# Patient Record
Sex: Female | Born: 1995 | Race: Black or African American | Hispanic: No | Marital: Single | Smoking: Never smoker
Health system: Southern US, Community
[De-identification: ages and names within clinical notes are randomized; demographics above are authoritative.]

## PROBLEM LIST (undated history)

## (undated) ENCOUNTER — Inpatient Hospital Stay (HOSPITAL_COMMUNITY): Payer: Self-pay

## (undated) DIAGNOSIS — Z8619 Personal history of other infectious and parasitic diseases: Secondary | ICD-10-CM

## (undated) DIAGNOSIS — A749 Chlamydial infection, unspecified: Secondary | ICD-10-CM

## (undated) DIAGNOSIS — B999 Unspecified infectious disease: Secondary | ICD-10-CM

## (undated) DIAGNOSIS — A549 Gonococcal infection, unspecified: Secondary | ICD-10-CM

## (undated) HISTORY — DX: Personal history of other infectious and parasitic diseases: Z86.19

## (undated) HISTORY — PX: UMBILICAL HERNIA REPAIR: SHX196

---
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
## 2013-06-09 ENCOUNTER — Emergency Department (HOSPITAL_COMMUNITY)
Admission: EM | Admit: 2013-06-09 | Discharge: 2013-06-09 | Discharge status: Against Medical Advice | Payer: Managed Care, Other (non HMO) | Attending: Emergency Medicine | Admitting: Emergency Medicine

## 2013-06-09 ENCOUNTER — Encounter (HOSPITAL_COMMUNITY): Payer: Self-pay | Admitting: Adult Health

## 2013-06-09 DIAGNOSIS — N949 Unspecified condition associated with female genital organs and menstrual cycle: Secondary | ICD-10-CM | POA: Insufficient documentation

## 2013-06-09 DIAGNOSIS — R109 Unspecified abdominal pain: Secondary | ICD-10-CM | POA: Insufficient documentation

## 2013-06-09 DIAGNOSIS — N898 Other specified noninflammatory disorders of vagina: Secondary | ICD-10-CM | POA: Insufficient documentation

## 2013-06-09 DIAGNOSIS — Z3202 Encounter for pregnancy test, result negative: Secondary | ICD-10-CM | POA: Insufficient documentation

## 2013-06-09 DIAGNOSIS — A6000 Herpesviral infection of urogenital system, unspecified: Secondary | ICD-10-CM | POA: Insufficient documentation

## 2013-06-09 LAB — URINALYSIS, ROUTINE W REFLEX MICROSCOPIC
Glucose, UA: NEGATIVE mg/dL
Ketones, ur: NEGATIVE mg/dL
Specific Gravity, Urine: 1.034 — ABNORMAL HIGH (ref 1.005–1.030)
Urobilinogen, UA: 1 mg/dL (ref 0.0–1.0)
pH: 6 (ref 5.0–8.0)

## 2013-06-09 LAB — URINE MICROSCOPIC-ADD ON

## 2013-06-09 LAB — POCT PREGNANCY, URINE: Preg Test, Ur: NEGATIVE

## 2013-06-09 NOTE — ED Notes (Signed)
Presents with left lower abdominal pain, herpes outbreak with lesions on anus and vaginal bleeding. Pt reports she was taking medication to prevent outbreaks but finished it and now all the symptoms came back.

## 2013-06-10 LAB — URINE CULTURE

## 2013-06-11 NOTE — ED Notes (Signed)
Patient contact and informed to follow up with PCP per Recommendation of ID Pharmacist-Michelle Turner

## 2013-07-05 ENCOUNTER — Emergency Department (HOSPITAL_COMMUNITY)
Admission: EM | Admit: 2013-07-05 | Discharge: 2013-07-05 | Disposition: A | Discharge status: Home / Self Care | Payer: Managed Care, Other (non HMO) | Attending: Emergency Medicine | Admitting: Emergency Medicine

## 2013-07-05 ENCOUNTER — Encounter (HOSPITAL_COMMUNITY): Payer: Self-pay | Admitting: Emergency Medicine

## 2013-07-05 DIAGNOSIS — N938 Other specified abnormal uterine and vaginal bleeding: Secondary | ICD-10-CM | POA: Insufficient documentation

## 2013-07-05 DIAGNOSIS — N949 Unspecified condition associated with female genital organs and menstrual cycle: Secondary | ICD-10-CM | POA: Insufficient documentation

## 2013-07-05 DIAGNOSIS — Z79899 Other long term (current) drug therapy: Secondary | ICD-10-CM | POA: Insufficient documentation

## 2013-07-05 DIAGNOSIS — L299 Pruritus, unspecified: Secondary | ICD-10-CM | POA: Insufficient documentation

## 2013-07-05 DIAGNOSIS — Z8619 Personal history of other infectious and parasitic diseases: Secondary | ICD-10-CM | POA: Insufficient documentation

## 2013-07-05 DIAGNOSIS — T384X5A Adverse effect of oral contraceptives, initial encounter: Secondary | ICD-10-CM | POA: Insufficient documentation

## 2013-07-05 DIAGNOSIS — R110 Nausea: Secondary | ICD-10-CM | POA: Insufficient documentation

## 2013-07-05 LAB — CBC WITH DIFFERENTIAL/PLATELET
Basophils Relative: 0 % (ref 0–1)
Eosinophils Absolute: 0.2 10*3/uL (ref 0.0–0.7)
HCT: 38.6 % (ref 36.0–46.0)
Hemoglobin: 13.7 g/dL (ref 12.0–15.0)
Lymphs Abs: 3.6 10*3/uL (ref 0.7–4.0)
MCH: 28 pg (ref 26.0–34.0)
MCHC: 35.5 g/dL (ref 30.0–36.0)
MCV: 78.9 fL (ref 78.0–100.0)
Monocytes Absolute: 0.6 10*3/uL (ref 0.1–1.0)
Monocytes Relative: 7 % (ref 3–12)
Neutro Abs: 4.7 10*3/uL (ref 1.7–7.7)
Neutrophils Relative %: 52 % (ref 43–77)
RBC: 4.89 MIL/uL (ref 3.87–5.11)
WBC: 9.1 10*3/uL (ref 4.0–10.5)

## 2013-07-05 LAB — WET PREP, GENITAL
Clue Cells Wet Prep HPF POC: NONE SEEN
WBC, Wet Prep HPF POC: NONE SEEN

## 2013-07-05 MED ORDER — KETOROLAC TROMETHAMINE 60 MG/2ML IM SOLN
60.0000 mg | Freq: Once | INTRAMUSCULAR | Status: AC
  Administered 2013-07-05: 60 mg via INTRAMUSCULAR
  Filled 2013-07-05: qty 2

## 2013-07-05 NOTE — ED Notes (Signed)
Per pt sts woke up this am with vaginal pain, itching, pelvic pain. sts foul odor and burning.

## 2013-07-05 NOTE — ED Provider Notes (Signed)
CSN: [id number]     Arrival date & time 07/05/13  1647 History   First MD Initiated Contact with Patient 07/05/13 1818     Chief Complaint  Patient presents with  . Vaginitis   (Consider location/radiation/quality/duration/timing/severity/associated sxs/prior Treatment) HPI  PT is G0P0, states she had started on depoprovera in January and she has had vaginal bleeding daily since. She states it has been heavy since February. She states today she had lower abdominal pain and took ibuprofen. She had nausea and felt hot, no vomiting. She c/o burning and itching in her groin. She states in June she had "cuts" in her groin and was diagnosed with herpes, and took acyclovir, but later had a test that was negative for herpes. States she is sexually active. She states when she stays on the acyclovir she has less groin pain but she has run out of her acyclovir.  She thinks she may have "cuts" again.   PCP [facility]  Past Medical History  Diagnosis Date  . Herpes genitalis in women    Past Surgical History  Procedure Laterality Date  . Umbilical hernia repair     History reviewed. No pertinent family history. History  Substance Use Topics  . Smoking status: Never Smoker   . Smokeless tobacco: Not on file  . Alcohol Use: No   employed  OB History   Grav Para Term Preterm Abortions TAB SAB Ect Mult Living                 Review of Systems  All other systems reviewed and are negative.    Allergies  Review of patient's allergies indicates no known allergies.  Home Medications   Current Outpatient Rx  Name  Route  Sig  Dispense  Refill  . acyclovir (ZOVIRAX) 200 MG capsule   Oral   Take 200 mg by mouth daily as needed (outbreak).         Marland Kitchen ibuprofen (ADVIL,MOTRIN) 200 MG tablet   Oral   Take 800 mg by mouth every 8 (eight) hours as needed for pain.          . MedroxyPROGESTERone Acetate (DEPO-PROVERA IM)   Intramuscular   Inject  into the muscle every 30 (thirty) days.          BP 125/72  Pulse 81  Temp(Src) 98.8 F (37.1 C) (Oral)  Resp 18  Wt 155 lb 9.6 oz (70.58 kg)  BMI 27.57 kg/m2  SpO2 98%  Vital signs normal   Physical Exam  Nursing note and vitals reviewed. Constitutional: She is oriented to person, place, and time. She appears well-developed and well-nourished.  Non-toxic appearance. She does not appear ill. No distress.  HENT:  Head: Normocephalic and atraumatic.  Right Ear: External ear normal.  Left Ear: External ear normal.  Nose: Nose normal. No mucosal edema or rhinorrhea.  Mouth/Throat: Oropharynx is clear and moist and mucous membranes are normal. No dental abscesses or uvula swelling.  Eyes: Conjunctivae and EOM are normal. Pupils are equal, round, and reactive to light.  Neck: Normal range of motion and full passive range of motion without pain. Neck supple.  Cardiovascular: Normal rate, regular rhythm and normal heart sounds.  Exam reveals no gallop and no friction rub.   No murmur heard. Pulmonary/Chest: Effort normal and breath sounds normal. No respiratory distress. She has no wheezes. She has no rhonchi. She has no rales. She exhibits no tenderness and no crepitus.  Abdominal: Soft.  Normal appearance and bowel sounds are normal. She exhibits no distension. There is tenderness in the left lower quadrant. There is no rebound and no guarding.  Genitourinary:  Normal external genitalia. Her groin was inspected and no ulcers or unusual lesions were seen on her labia or in her labia or around her vaginal opening. She has a small amount of dark blood in her fall. Her uterus is tender to palpation and does not feel enlarged. Her adnexa are nontender and are nonpalpable.  Musculoskeletal: Normal range of motion. She exhibits no edema and no tenderness.  Moves all extremities well.   Neurological: She is alert and oriented to person, place, and time. She has normal strength. No cranial nerve  deficit.  Skin: Skin is warm, dry and intact. No rash noted. No erythema. No pallor.  Psychiatric: She has a normal mood and affect. Her speech is normal and behavior is normal. Her mood appears not anxious.    ED Course  Procedures (including critical care time)  Medications  ketorolac (TORADOL) injection 60 mg (60 mg Intramuscular Given 07/05/13 1928)    Labs Review Results for orders placed during the hospital encounter of 07/05/13  WET PREP, GENITAL      Result Value Range   Yeast Wet Prep HPF POC NONE SEEN  NONE SEEN   Trich, Wet Prep NONE SEEN  NONE SEEN   Clue Cells Wet Prep HPF POC NONE SEEN  NONE SEEN   WBC, Wet Prep HPF POC NONE SEEN  NONE SEEN  CBC WITH DIFFERENTIAL      Result Value Range   WBC 9.1  4.0 - 10.5 K/uL   RBC 4.89  3.87 - 5.11 MIL/uL   Hemoglobin 13.7  12.0 - 15.0 g/dL   HCT 41.6  60.6 - 30.1 %   MCV 78.9  78.0 - 100.0 fL   MCH 28.0  26.0 - 34.0 pg   MCHC 35.5  30.0 - 36.0 g/dL   RDW 60.1  09.3 - 23.5 %   Platelets 332  150 - 400 K/uL   Neutrophils Relative % 52  43 - 77 %   Neutro Abs 4.7  1.7 - 7.7 K/uL   Lymphocytes Relative 40  12 - 46 %   Lymphs Abs 3.6  0.7 - 4.0 K/uL   Monocytes Relative 7  3 - 12 %   Monocytes Absolute 0.6  0.1 - 1.0 K/uL   Eosinophils Relative 2  0 - 5 %   Eosinophils Absolute 0.2  0.0 - 0.7 K/uL   Basophils Relative 0  0 - 1 %   Basophils Absolute 0.0  0.0 - 0.1 K/uL   Laboratory interpretation all normal      MDM   1. DUB (dysfunctional uterine bleeding)       Plan discharge   Devoria Albe, MD, Armando Gang    Ward Givens, MD 07/05/13 2025

## 2013-07-05 NOTE — Discharge Instructions (Signed)
You need to discuss your persistent vaginal bleeding since you started the depoprovera. Your initial swab for infection was negative. You will be called if your STD tests are positive.    Abnormal Uterine Bleeding Abnormal uterine bleeding can have many causes. Some cases are simply treated, while others are more serious. There are several kinds of bleeding that is considered abnormal, including:  Bleeding between periods.  Bleeding after sexual intercourse.  Spotting anytime in the menstrual cycle.  Bleeding heavier or more than normal.  Bleeding after menopause. CAUSES  There are many causes of abnormal uterine bleeding. It can be present in teenagers, pregnant women, women during their reproductive years, and women who have reached menopause. Your caregiver will look for the more common causes depending on your age, signs, symptoms and your particular circumstance. Most cases are not serious and can be treated. Even the more serious causes, like cancer of the female organs, can be treated adequately if found in the early stages. That is why all types of bleeding should be evaluated and treated as soon as possible. DIAGNOSIS  Diagnosing the cause may take several kinds of tests. Your caregiver may:  Take a complete history of the type of bleeding.  Perform a complete physical exam and Pap smear.  Take an ultrasound on the abdomen showing a picture of the female organs and the pelvis.  Inject dye into the uterus and Fallopian tubes and X-ray them (hysterosalpingogram).  Place fluid in the uterus and do an ultrasound (sonohysterogrqphy).  Take a CT scan to examine the female organs and pelvis.  Take an MRI to examine the female organs and pelvis. There is no X-ray involved with this procedure.  Look inside the uterus with a telescope that has a light at the end (hysteroscopy).  Scrap the inside of the uterus to get tissue to examine (Dilatation and Curettage, D&C).  Look into  the pelvis with a telescope that has a light at the end (laparoscopy). This is done through a very small cut (incision) in the abdomen. TREATMENT  Treatment will depend on the cause of the abnormal bleeding. It can include:  Doing nothing to allow the problem to take care of itself over time.  Hormone treatment.  Birth control pills.  Treating the medical condition causing the problem.  Laparoscopy.  Major or minor surgery  Destroying the lining of the uterus with electrical currant, laser, freezing or heat (uterine ablation). HOME CARE INSTRUCTIONS   Follow your caregiver's recommendation on how to treat your problem.  See your caregiver if you missed a menstrual period and think you may be pregnant.  If you are bleeding heavily, count the number of pads/tampons you use and how often you have to change them. Tell this to your caregiver.  Avoid sexual intercourse until the problem is controlled. SEEK MEDICAL CARE IF:   You have any kind of abnormal bleeding mentioned above.  You feel dizzy at times.  You are 17 years old and have not had a menstrual period yet. SEEK IMMEDIATE MEDICAL CARE IF:   You pass out.  You are changing pads/tampons every 15 to 30 minutes.  You have belly (abdominal) pain.  You have a temperature of 100 F (37.8 C) or higher.  You become sweaty or weak.  You are passing large blood clots from the vagina.  You start to feel sick to your stomach (nauseous) and throw up (vomit). Document Released: 08/27/2005 Document Revised: 11/19/2011 Document Reviewed: 01/20/2009 [employer] Patient Information 2014  [employer] ° °

## 2013-07-06 LAB — HIV ANTIBODY (ROUTINE TESTING W REFLEX): HIV: NONREACTIVE

## 2013-07-06 LAB — GC/CHLAMYDIA PROBE AMP: GC Probe RNA: NEGATIVE

## 2013-07-06 LAB — RPR: RPR Ser Ql: NONREACTIVE

## 2013-09-05 ENCOUNTER — Encounter (HOSPITAL_COMMUNITY): Payer: Self-pay | Admitting: *Deleted

## 2013-09-05 ENCOUNTER — Inpatient Hospital Stay (HOSPITAL_COMMUNITY)
Admission: AD | Admit: 2013-09-05 | Discharge: 2013-09-05 | Disposition: A | Discharge status: Home / Self Care | Payer: Managed Care, Other (non HMO) | Source: Ambulatory Visit | Attending: Obstetrics and Gynecology | Admitting: Obstetrics and Gynecology

## 2013-09-05 ENCOUNTER — Inpatient Hospital Stay (HOSPITAL_COMMUNITY): Payer: Managed Care, Other (non HMO)

## 2013-09-05 DIAGNOSIS — G8929 Other chronic pain: Secondary | ICD-10-CM | POA: Insufficient documentation

## 2013-09-05 DIAGNOSIS — N920 Excessive and frequent menstruation with regular cycle: Secondary | ICD-10-CM | POA: Insufficient documentation

## 2013-09-05 DIAGNOSIS — N949 Unspecified condition associated with female genital organs and menstrual cycle: Secondary | ICD-10-CM | POA: Insufficient documentation

## 2013-09-05 DIAGNOSIS — A6000 Herpesviral infection of urogenital system, unspecified: Secondary | ICD-10-CM | POA: Insufficient documentation

## 2013-09-05 LAB — URINE MICROSCOPIC-ADD ON

## 2013-09-05 LAB — URINALYSIS, ROUTINE W REFLEX MICROSCOPIC
Bilirubin Urine: NEGATIVE
Glucose, UA: NEGATIVE mg/dL
Ketones, ur: NEGATIVE mg/dL
Nitrite: NEGATIVE
Protein, ur: NEGATIVE mg/dL
Specific Gravity, Urine: 1.025 (ref 1.005–1.030)

## 2013-09-05 LAB — POCT PREGNANCY, URINE: Preg Test, Ur: NEGATIVE

## 2013-09-05 MED ORDER — TRAMADOL HCL 50 MG PO TABS: 50.0000 mg | ORAL_TABLET | Freq: Four times a day (QID) | ORAL | Status: DC | PRN

## 2013-09-05 MED ORDER — KETOROLAC TROMETHAMINE 10 MG PO TABS: 10.0000 mg | ORAL_TABLET | Freq: Four times a day (QID) | ORAL | Status: DC | PRN

## 2013-09-05 MED ORDER — KETOROLAC TROMETHAMINE 60 MG/2ML IM SOLN
60.0000 mg | Freq: Once | INTRAMUSCULAR | Status: AC
  Administered 2013-09-05: 60 mg via INTRAMUSCULAR
  Filled 2013-09-05: qty 2

## 2013-09-05 NOTE — MAU Note (Signed)
Pt reports she has had increased vaginal bleeding and pelvic pain . Dx with herpes last year. Had an outbreak. Pt has been on Depo last January (get shot at [facility]) and has had continuous bleeding.  Blededing heavier the past 2 days with cramping. Called [facility]

## 2013-09-05 NOTE — MAU Provider Note (Signed)
History   17 yo G0 presented unannounced c/o 2 weeks of increased vaginal bleeding and pelvic pain.   On Depo x 1 year, with ongoing bleeding since initiation.  Started on OCPs due to menorrhagia with cycles--previous Seasonique user, but couldn't remember to take daily pill.    Started Depo in January, 2014, in an attempt to control bleeding.  Has had on-going issues with pelvic pain, with several ER visits for same over last few years.  Had recent HSV outbreak, took Acyclovir with benefit.   "Have had all kinds of trouble since I became sexually active".  Was told she had "something on her cervix" on her last exam.    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  Has taken Aleve around the clock last 24 hours with minimal benefit.  Last Depo October--due next one by Jan 1.  Receives her Depo at her primary MD office.    Seen at [facility] in September as new patient by Dr. Merlinda Frederick normal pelvic US.  Plan for repeat HSV titers in 6 months due to positive HSV IgM, but negative HSV 1 and 2 IgG titers.  Seen at primary [facility] practice in October, with negative GC/chlamydia then.  Chief Complaint  Patient presents with  . Vaginal Bleeding   HPI:  See above  OB History   Grav Para Term Preterm Abortions TAB SAB Ect Mult Living   0               Past Medical History  Diagnosis Date  . Herpes genitalis in women     Past Surgical History  Procedure Laterality Date  . Umbilical hernia repair      No family history on file.  History  Substance Use Topics  . Smoking status: Never Smoker   . Smokeless tobacco: Not on file  . Alcohol Use: No    Allergies: No Known Allergies  Prescriptions prior to admission  Medication Sig Dispense Refill  . acyclovir (ZOVIRAX) 200 MG capsule Take 200 mg by mouth daily as needed (outbreak).      Marland Kitchen ibuprofen (ADVIL,MOTRIN) 200 MG tablet Take 800 mg by mouth every 8 (eight) hours as needed for pain.       .  medroxyPROGESTERone (DEPO-PROVERA) 150 MG/ML injection Inject 150 mg into the muscle every 3 (three) months.        ROS:  Pelvic pain, vaginal bleeding Physical Exam   Blood pressure 142/90, pulse 96, temperature 98.8 F (37.1 C), temperature source Oral, resp. rate 18, height 5\' 3"  (1.6 m), weight 164 lb 6.4 oz (74.571 kg).  Physical Exam Chest clear Heart RRR without murmur Abd soft, NT, no rebound or guarding No CVAT Pelvic--moderate blood in vault, with small clots--minimal active bleeding at present.  Uterus small, moderately tender, no CMT, small nabothian cyst noted on cervix.  Left adnexa more tender than right, no masses or fullness noted. Ext WNL  ED Course  Pelvic pain Menorrhagia--on Depo Hx HSV  Plan: Toradol 60 mg IM now GC, chlamydia UPT CBC Pelvic US Reviewed issues of chronic pelvic pain and menorrhagia with patient, with discussion of focus of ER visit as emergent evaluation of health-threatening issues, with plan for office f/u for more long-term management of the issues.   Nigel Bridgeman CNM, [place] 09/05/2013 5:04 PM  Addendum: Returned from [place]. Pain improved with Toradol. Small amount bleeding.  IMPRESSION:  Mildly inhomogeneous endometrium without measurable focal  abnormality. This could be due to  timing with respect to the  patient's cycle, although polyp, hyperplasia/neoplasia, or  submucosal fibroid could appear similar. Consider reimaging during  the week following the patient's menses if possible in 4-6 weeks.  Results for orders placed during the hospital encounter of 09/05/13 (from the past 24 hour(s))  URINALYSIS, ROUTINE W REFLEX MICROSCOPIC     Status: Abnormal   Collection Time    09/05/13  3:07 PM      Result Value Range   Color, Urine YELLOW  YELLOW   APPearance HAZY (*) CLEAR   Specific Gravity, Urine 1.025  1.005 - 1.030   pH 8.0  5.0 - 8.0   Glucose, UA NEGATIVE  NEGATIVE mg/dL   Hgb urine dipstick TRACE (*) NEGATIVE    Bilirubin Urine NEGATIVE  NEGATIVE   Ketones, ur NEGATIVE  NEGATIVE mg/dL   Protein, ur NEGATIVE  NEGATIVE mg/dL   Urobilinogen, UA 0.2  0.0 - 1.0 mg/dL   Nitrite NEGATIVE  NEGATIVE   Leukocytes, UA NEGATIVE  NEGATIVE  URINE MICROSCOPIC-ADD ON     Status: Abnormal   Collection Time    09/05/13  3:07 PM      Result Value Range   Squamous Epithelial / LPF FEW (*) RARE   WBC, UA 0-2  <3 WBC/hpf   RBC / HPF 0-2  <3 RBC/hpf   Bacteria, UA FEW (*) RARE  POCT PREGNANCY, URINE     Status: None   Collection Time    09/05/13  3:11 PM      Result Value Range   Preg Test, Ur NEGATIVE  NEGATIVE   Consulted with Dr. Dion Body. D/C home Rx Toradol 10 mg po q 6 hours prn x 5 days--sent to [facility] (originally sent to [facility], but cancelled due to patient requesting 24 hour pharmacy--left message on VM to cancel). Rx Ultram 50 mg po q 6 hours prn, #36, no refills--to start after completing the Toradol. Office will f/u with patient on Monday to get an appt scheduled for the patient with Dr. Normand Sloop or Henreitta Leber, PA-C. Note for patient to be OOW tomorrow.  Nigel Bridgeman, CNM 09/05/13 8:30pm.

## 2013-09-07 LAB — GC/CHLAMYDIA PROBE AMP
CT Probe RNA: NEGATIVE
GC Probe RNA: NEGATIVE

## 2014-01-17 ENCOUNTER — Inpatient Hospital Stay (HOSPITAL_COMMUNITY)
Admission: AD | Admit: 2014-01-17 | Discharge: 2014-01-18 | Disposition: A | Discharge status: Home / Self Care | Payer: Managed Care, Other (non HMO) | Source: Ambulatory Visit | Attending: Obstetrics and Gynecology | Admitting: Obstetrics and Gynecology

## 2014-01-17 ENCOUNTER — Encounter (HOSPITAL_COMMUNITY): Payer: Self-pay

## 2014-01-17 DIAGNOSIS — N949 Unspecified condition associated with female genital organs and menstrual cycle: Secondary | ICD-10-CM | POA: Insufficient documentation

## 2014-01-17 DIAGNOSIS — R300 Dysuria: Secondary | ICD-10-CM | POA: Insufficient documentation

## 2014-01-17 DIAGNOSIS — B009 Herpesviral infection, unspecified: Secondary | ICD-10-CM | POA: Insufficient documentation

## 2014-01-17 LAB — WET PREP, GENITAL
TRICH WET PREP: NONE SEEN
Yeast Wet Prep HPF POC: NONE SEEN

## 2014-01-17 LAB — URINALYSIS, ROUTINE W REFLEX MICROSCOPIC
BILIRUBIN URINE: NEGATIVE
Glucose, UA: NEGATIVE mg/dL
Hgb urine dipstick: NEGATIVE
Ketones, ur: NEGATIVE mg/dL
Leukocytes, UA: NEGATIVE
Nitrite: NEGATIVE
PH: 6 (ref 5.0–8.0)
PROTEIN: NEGATIVE mg/dL
Specific Gravity, Urine: >1.03 — ABNORMAL HIGH (ref 1.005–1.030)
Urobilinogen, UA: 2 mg/dL — ABNORMAL HIGH (ref 0.0–1.0)

## 2014-01-17 LAB — POCT PREGNANCY, URINE: Preg Test, Ur: NEGATIVE

## 2014-01-17 MED ORDER — IBUPROFEN 600 MG PO TABS
600.0000 mg | ORAL_TABLET | Freq: Once | ORAL | Status: AC
  Administered 2014-01-18: 600 mg via ORAL
  Filled 2014-01-17: qty 1

## 2014-01-17 NOTE — MAU Provider Note (Signed)
  History     CSN: [id number]  Arrival date and time: 01/17/14 2221   None     Chief Complaint  Patient presents with  . Dysuria   HPI Comments: Pt is a 17yo G1P0 arrives unannounced w c/o burning w urination for about 2 days. Also having intermittent L sided lower pelvic pain. LMP 4/18, no on contraception, states she is sexually active. Was [facility] by Dr Normand Sloopillard in Sept 2014 for w/u due to menorrhagia, has not followed up. Then seen here in MAU in Dec 2014 for similar complaints.   Dysuria  Pertinent negatives include no nausea or vomiting.      Past Medical History  Diagnosis Date  . Herpes genitalis in women     Past Surgical History  Procedure Laterality Date  . Umbilical hernia repair      History reviewed. No pertinent family history.  History  Substance Use Topics  . Smoking status: Never Smoker   . Smokeless tobacco: Not on file  . Alcohol Use: No    Allergies: No Known Allergies  Prescriptions prior to admission  Medication Sig Dispense Refill  . acyclovir (ZOVIRAX) 200 MG capsule Take 200 mg by mouth daily as needed (outbreak).      Marland Kitchen. ibuprofen (ADVIL,MOTRIN) 200 MG tablet Take 800 mg by mouth every 8 (eight) hours as needed for pain.       Marland Kitchen. ketorolac (TORADOL) 10 MG tablet Take 1 tablet (10 mg total) by mouth every 6 (six) hours as needed.  20 tablet  0  . medroxyPROGESTERone (DEPO-PROVERA) 150 MG/ML injection Inject 150 mg into the muscle every 3 (three) months.      . traMADol (ULTRAM) 50 MG tablet Take 1 tablet (50 mg total) by mouth every 6 (six) hours as needed.  36 tablet  0    Review of Systems  Gastrointestinal: Positive for abdominal pain. Negative for nausea and vomiting.  Genitourinary: Positive for dysuria.  All other systems reviewed and are negative.  Physical Exam   Blood pressure 120/69, pulse 84, temperature 98 F (36.7 C), temperature source Oral, resp. rate 18, height 5\' 4"  (1.626 m), weight 174 lb (78.926 kg), last  menstrual period 12/26/2013.  Physical Exam  Nursing note and vitals reviewed. Constitutional: She is oriented to person, place, and time. She appears well-developed and well-nourished. No distress.  Laughing w friends in room   Cardiovascular: Normal rate.   Respiratory: Effort normal.  GI: Soft. She exhibits no distension. There is no tenderness. There is no rebound and no guarding.  Genitourinary: Vaginal discharge found.  sm amt white thick discharge in vault +CMT, no masses felt   Musculoskeletal: Normal range of motion.  Neurological: She is alert and oriented to person, place, and time. She has normal reflexes.  Skin: Skin is warm and dry.  Psychiatric: She has a normal mood and affect. Her behavior is normal.    MAU Course  Procedures    Assessment and Plan  17yo pt w dysuria  UA normal, will send for cx Wet prep pending GC/CT sent Will give PO motrin   Malissa HippoShelley M Joycelyn Liska 01/17/2014, 11:46 PM    Addendum at 1214am  Wet prep +clue cells  Pt prefers metrogel Rx sent to pharmacy Instructed to f/u office 1-2wks for further evaluation and repeat blood work as discussed at last OV by Dr Normand Sloopillard  dc'd home stable condition May take OTC ibuprofen PRN  S.Harlem Bula, CNM

## 2014-01-17 NOTE — MAU Note (Signed)
Pt presents complaining of burning with urination that started 3 days ago. Denies vaginal bleeding or discharge.

## 2014-01-17 NOTE — Progress Notes (Signed)
Notified of pt arrival in MAU and current complaint. Will come see pt 

## 2014-01-18 LAB — URINE CULTURE: Colony Count: 4000

## 2014-01-18 LAB — GC/CHLAMYDIA PROBE AMP
CT PROBE, AMP APTIMA: NEGATIVE
GC PROBE AMP APTIMA: NEGATIVE

## 2014-01-18 MED ORDER — METRONIDAZOLE 0.75 % VA GEL: 1.0000 | Freq: Every day | VAGINAL | Status: AC

## 2014-01-18 NOTE — Discharge Instructions (Signed)
Bacterial Vaginosis Bacterial vaginosis is a vaginal infection that occurs when the normal balance of bacteria in the vagina is disrupted. It results from an overgrowth of certain bacteria. This is the most common vaginal infection in women of childbearing age. Treatment is important to prevent complications, especially in pregnant women, as it can cause a premature delivery. CAUSES  Bacterial vaginosis is caused by an increase in harmful bacteria that are normally present in smaller amounts in the vagina. Several different kinds of bacteria can cause bacterial vaginosis. However, the reason that the condition develops is not fully understood. RISK FACTORS Certain activities or behaviors can put you at an increased risk of developing bacterial vaginosis, including:  Having a new sex partner or multiple sex partners.  Douching.  Using an intrauterine device (IUD) for contraception. Women do not get bacterial vaginosis from toilet seats, bedding, swimming pools, or contact with objects around them. SIGNS AND SYMPTOMS  Some women with bacterial vaginosis have no signs or symptoms. Common symptoms include:  Grey vaginal discharge.  A fishlike odor with discharge, especially after sexual intercourse.  Itching or burning of the vagina and vulva.  Burning or pain with urination. DIAGNOSIS  Your health care provider will take a medical history and examine the vagina for signs of bacterial vaginosis. A sample of vaginal fluid may be taken. Your health care provider will look at this sample under a microscope to check for bacteria and abnormal cells. A vaginal pH test may also be done.  TREATMENT  Bacterial vaginosis may be treated with antibiotic medicines. These may be given in the form of a pill or a vaginal cream. A second round of antibiotics may be prescribed if the condition comes back after treatment.  HOME CARE INSTRUCTIONS   Only take over-the-counter or prescription medicines as  directed by your health care provider.  If antibiotic medicine was prescribed, take it as directed. Make sure you finish it even if you start to feel better.  Do not have sex until treatment is completed.  Tell all sexual partners that you have a vaginal infection. They should see their health care provider and be treated if they have problems, such as a mild rash or itching.  Practice safe sex by using condoms and only having one sex partner. SEEK MEDICAL CARE IF:   Your symptoms are not improving after 3 days of treatment.  You have increased discharge or pain.  You have a fever. MAKE SURE YOU:   Understand these instructions.  Will watch your condition.  Will get help right away if you are not doing well or get worse. FOR MORE INFORMATION  Centers for Disease Control and Prevention, Division of STD Prevention: SolutionApps.co.zawww.cdc.gov/std American Sexual Health Association (ASHA): www.ashastd.org  Document Released: 08/27/2005 Document Revised: 06/17/2013 Document Reviewed: 04/08/2013 [facility] The[facility]Care Patient Information 2014 [place], [place].  Pelvic Pain, Female Female pelvic pain can be caused by many different things and start from a variety of places. Pelvic pain refers to pain that is located in the lower half of the abdomen and between your hips. The pain may occur over a short period of time (acute) or may be reoccurring (chronic). The cause of pelvic pain may be related to disorders affecting the female reproductive organs (gynecologic), but it may also be related to the bladder, kidney stones, an intestinal complication, or muscle or skeletal problems. Getting help right away for pelvic pain is important, especially if there has been severe, sharp, or a sudden onset of unusual pain.  It is also important to get help right away because some types of pelvic pain can be life threatening.  CAUSES  Below are only some of the causes of pelvic pain. The causes of pelvic pain can be in one of several  categories.   Gynecologic.  Pelvic inflammatory disease.  Sexually transmitted infection.  Ovarian cyst or a twisted ovarian ligament (ovarian torsion).  Uterine lining that grows outside the uterus (endometriosis).  Fibroids, cysts, or tumors.  Ovulation.  Pregnancy.  Pregnancy that occurs outside the uterus (ectopic pregnancy).  Miscarriage.  Labor.  Abruption of the placenta or ruptured uterus.  Infection.  Uterine infection (endometritis).  Bladder infection.  Diverticulitis.  Miscarriage related to a uterine infection (septic abortion).  Bladder.  Inflammation of the bladder (cystitis).  Kidney stone(s).  Gastrointenstinal.  Constipation.  Diverticulitis.  Neurologic.  Trauma.  Feeling pelvic pain because of mental or emotional causes (psychosomatic).  Cancers of the bowel or pelvis. EVALUATION  Your caregiver will want to take a careful history of your concerns. This includes recent changes in your health, a careful gynecologic history of your periods (menses), and a sexual history. Obtaining your family history and medical history is also important. Your caregiver may suggest a pelvic exam. A pelvic exam will help identify the location and severity of the pain. It also helps in the evaluation of which organ system may be involved. In order to identify the cause of the pelvic pain and be properly treated, your caregiver may order tests. These tests may include:   A pregnancy test.  Pelvic ultrasonography.  An X-ray exam of the abdomen.  A urinalysis or evaluation of vaginal discharge.  Blood tests. HOME CARE INSTRUCTIONS   Only take over-the-counter or prescription medicines for pain, discomfort, or fever as directed by your caregiver.   Rest as directed by your caregiver.   Eat a balanced diet.   Drink enough fluids to make your urine clear or pale yellow, or as directed.   Avoid sexual intercourse if it causes pain.   Apply  warm or cold compresses to the lower abdomen depending on which one helps the pain.   Avoid stressful situations.   Keep a journal of your pelvic pain. Write down when it started, where the pain is located, and if there are things that seem to be associated with the pain, such as food or your menstrual cycle.  Follow up with your caregiver as directed.  SEEK MEDICAL CARE IF:  Your medicine does not help your pain.  You have abnormal vaginal discharge. SEEK IMMEDIATE MEDICAL CARE IF:   You have heavy bleeding from the vagina.   Your pelvic pain increases.   You feel lightheaded or faint.   You have chills.   You have pain with urination or blood in your urine.   You have uncontrolled diarrhea or vomiting.   You have a fever or persistent symptoms for more than 3 days.  You have a fever and your symptoms suddenly get worse.   You are being physically or sexually abused.  MAKE SURE YOU:  Understand these instructions.  Will watch your condition.  Will get help if you are not doing well or get worse. Document Released: 07/24/2004 Document Revised: 02/26/2012 Document Reviewed: 12/17/2011 [facility] Patient Information 2014 [place], [place].  Safe Sex Safe sex is about reducing the risk of giving or getting a sexually transmitted disease (STD). STDs are spread through sexual contact involving the genitals, mouth, or rectum. Some STDS can  be cured and others cannot. Safe sex can also prevent unintended pregnancies.  SAFE SEX PRACTICES  Limit your sexual activity to only one partner who is only having sex with you.  Talk to your partner about their past partners, past STDs, and drug use.  Use a condom every time you have sexual intercourse. This includes vaginal, oral, and anal sexual activity. Both females and males should wear condoms during oral sex. Only use latex or polyurethane condoms and water-based lubricants. Petroleum-based lubricants or oils used to  lubricate a condom will weaken the condom and increase the chance that it will break. The condom should be in place from the beginning to the end of sexual activity. Wearing a condom reduces, but does not completely eliminate, your risk of getting or giving a STD. STDs can be spread by contact with skin of surrounding areas.  Get vaccinated for hepatitis B and HPV.  Avoid alcohol and recreational drugs which can affect your judgement. You may forget to use a condom or participate in high-risk sex.  For females, avoid douching after sexual intercourse. Douching can spread an infection farther into the reproductive tract.  Check your body for signs of sores, blisters, rashes, or unusual discharge. See your caregiver if you notice any of these signs.  Avoid sexual contact if you have symptoms of an infection or are being treated for an STD. If you or your partner has herpes, avoid sexual contact when blisters are present. Use condoms at all other times.  See your caregiver for regular screenings, examinations, and tests for STDs. Before having sex with a new partner, each of you should be screened for STDs and talk about the results with your partner. BENEFITS OF SAFE SEX   There is less of a chance of getting or giving an STD.  You can prevent unwanted or unintended pregnancies.  By discussing safer sex concerns with your partner, you may increase feelings of intimacy, comfort, trust, and honesty between the both of you. Document Released: 10/04/2004 Document Revised: 05/21/2012 Document Reviewed: 02/18/2012 [facility] Patient Information 2014 [place], [place].

## 2014-02-04 ENCOUNTER — Inpatient Hospital Stay (HOSPITAL_COMMUNITY)
Admission: AD | Admit: 2014-02-04 | Discharge: 2014-02-04 | Disposition: A | Discharge status: Home / Self Care | Payer: Managed Care, Other (non HMO) | Source: Ambulatory Visit | Attending: Obstetrics and Gynecology | Admitting: Obstetrics and Gynecology

## 2014-02-04 ENCOUNTER — Encounter (HOSPITAL_COMMUNITY): Payer: Self-pay | Admitting: *Deleted

## 2014-02-04 ENCOUNTER — Inpatient Hospital Stay (HOSPITAL_COMMUNITY): Payer: Managed Care, Other (non HMO)

## 2014-02-04 DIAGNOSIS — N760 Acute vaginitis: Secondary | ICD-10-CM | POA: Insufficient documentation

## 2014-02-04 DIAGNOSIS — B9689 Other specified bacterial agents as the cause of diseases classified elsewhere: Secondary | ICD-10-CM | POA: Insufficient documentation

## 2014-02-04 DIAGNOSIS — A499 Bacterial infection, unspecified: Secondary | ICD-10-CM | POA: Insufficient documentation

## 2014-02-04 DIAGNOSIS — B3731 Acute candidiasis of vulva and vagina: Secondary | ICD-10-CM | POA: Insufficient documentation

## 2014-02-04 DIAGNOSIS — R109 Unspecified abdominal pain: Secondary | ICD-10-CM | POA: Insufficient documentation

## 2014-02-04 DIAGNOSIS — B373 Candidiasis of vulva and vagina: Secondary | ICD-10-CM | POA: Insufficient documentation

## 2014-02-04 LAB — HEPATITIS B SURFACE ANTIGEN: HEP B S AG: NEGATIVE

## 2014-02-04 LAB — WET PREP, GENITAL: Trich, Wet Prep: NONE SEEN

## 2014-02-04 LAB — CBC WITH DIFFERENTIAL/PLATELET
BASOS PCT: 0 % (ref 0–1)
Basophils Absolute: 0 10*3/uL (ref 0.0–0.1)
Eosinophils Absolute: 0.1 10*3/uL (ref 0.0–0.7)
Eosinophils Relative: 2 % (ref 0–5)
HCT: 36.6 % (ref 36.0–46.0)
HEMOGLOBIN: 12.8 g/dL (ref 12.0–15.0)
Lymphocytes Relative: 38 % (ref 12–46)
Lymphs Abs: 3.3 10*3/uL (ref 0.7–4.0)
MCH: 28.4 pg (ref 26.0–34.0)
MCHC: 35 g/dL (ref 30.0–36.0)
MCV: 81.2 fL (ref 78.0–100.0)
MONOS PCT: 7 % (ref 3–12)
Monocytes Absolute: 0.6 10*3/uL (ref 0.1–1.0)
NEUTROS PCT: 53 % (ref 43–77)
Neutro Abs: 4.5 10*3/uL (ref 1.7–7.7)
Platelets: 328 10*3/uL (ref 150–400)
RBC: 4.51 MIL/uL (ref 3.87–5.11)
RDW: 13.4 % (ref 11.5–15.5)
WBC: 8.6 10*3/uL (ref 4.0–10.5)

## 2014-02-04 LAB — URINALYSIS, ROUTINE W REFLEX MICROSCOPIC
Bilirubin Urine: NEGATIVE
GLUCOSE, UA: NEGATIVE mg/dL
HGB URINE DIPSTICK: NEGATIVE
Ketones, ur: NEGATIVE mg/dL
Nitrite: NEGATIVE
PH: 6.5 (ref 5.0–8.0)
Protein, ur: NEGATIVE mg/dL
Specific Gravity, Urine: 1.025 (ref 1.005–1.030)
Urobilinogen, UA: 0.2 mg/dL (ref 0.0–1.0)

## 2014-02-04 LAB — URINE MICROSCOPIC-ADD ON

## 2014-02-04 LAB — POCT PREGNANCY, URINE: PREG TEST UR: NEGATIVE

## 2014-02-04 LAB — HIV ANTIBODY (ROUTINE TESTING W REFLEX): HIV 1&2 Ab, 4th Generation: NONREACTIVE

## 2014-02-04 LAB — RPR

## 2014-02-04 LAB — HEPATITIS C ANTIBODY: HCV AB: NEGATIVE

## 2014-02-04 MED ORDER — FLUCONAZOLE 150 MG PO TABS: 150.0000 mg | ORAL_TABLET | Freq: Every day | ORAL | Status: DC

## 2014-02-04 MED ORDER — METRONIDAZOLE 500 MG PO TABS: 500.0000 mg | ORAL_TABLET | Freq: Two times a day (BID) | ORAL | Status: AC

## 2014-02-04 MED ORDER — KETOROLAC TROMETHAMINE 30 MG/ML IJ SOLN
30.0000 mg | Freq: Once | INTRAMUSCULAR | Status: AC
  Administered 2014-02-04: 30 mg via INTRAMUSCULAR
  Filled 2014-02-04: qty 1

## 2014-02-04 MED ORDER — CLOTRIMAZOLE-BETAMETHASONE 1-0.05 % EX CREA: 1.0000 "application " | TOPICAL_CREAM | Freq: Two times a day (BID) | CUTANEOUS | Status: DC

## 2014-02-04 MED ORDER — FLUCONAZOLE 150 MG PO TABS
150.0000 mg | ORAL_TABLET | Freq: Once | ORAL | Status: AC
  Administered 2014-02-04: 150 mg via ORAL
  Filled 2014-02-04: qty 1

## 2014-02-04 NOTE — Discharge Instructions (Signed)
Candidal Vulvovaginitis Candidal vulvovaginitis is an infection of the vagina and vulva. The vulva is the skin around the opening of the vagina. This may cause itching and discomfort in and around the vagina.  HOME CARE  Only take medicine as told by your doctor.  Do not have sex (intercourse) until the infection is healed or as told by your doctor.  Practice safe sex.  Tell your sex partner about your infection.  Do not douche or use tampons.  Wear cotton underwear. Do not wear tight pants or panty hose.  Eat yogurt. This may help treat and prevent yeast infections. GET HELP RIGHT AWAY IF:   You have a fever.  Your problems get worse during treatment or do not get better in 3 days.  You have discomfort, irritation, or itching in your vagina or vulva area.  You have pain after sex.  You start to get belly (abdominal) pain. MAKE SURE YOU:  Understand these instructions.  Will watch your condition.  Will get help right away if you are not doing well or get worse. Document Released: 11/23/2008 Document Revised: 11/19/2011 Document Reviewed: 11/23/2008 ExitCare Patient Information 2014 ExitCare, LLC. Bacterial Vaginosis Bacterial vaginosis is a vaginal infection that occurs when the normal balance of bacteria in the vagina is disrupted. It results from an overgrowth of certain bacteria. This is the most common vaginal infection in women of childbearing age. Treatment is important to prevent complications, especially in pregnant women, as it can cause a premature delivery. CAUSES  Bacterial vaginosis is caused by an increase in harmful bacteria that are normally present in smaller amounts in the vagina. Several different kinds of bacteria can cause bacterial vaginosis. However, the reason that the condition develops is not fully understood. RISK FACTORS Certain activities or behaviors can put you at an increased risk of developing bacterial vaginosis, including:  Having a new  sex partner or multiple sex partners.  Douching.  Using an intrauterine device (IUD) for contraception. Women do not get bacterial vaginosis from toilet seats, bedding, swimming pools, or contact with objects around them. SIGNS AND SYMPTOMS  Some women with bacterial vaginosis have no signs or symptoms. Common symptoms include:  Grey vaginal discharge.  A fishlike odor with discharge, especially after sexual intercourse.  Itching or burning of the vagina and vulva.  Burning or pain with urination. DIAGNOSIS  Your health care provider will take a medical history and examine the vagina for signs of bacterial vaginosis. A sample of vaginal fluid may be taken. Your health care provider will look at this sample under a microscope to check for bacteria and abnormal cells. A vaginal pH test may also be done.  TREATMENT  Bacterial vaginosis may be treated with antibiotic medicines. These may be given in the form of a pill or a vaginal cream. A second round of antibiotics may be prescribed if the condition comes back after treatment.  HOME CARE INSTRUCTIONS   Only take over-the-counter or prescription medicines as directed by your health care provider.  If antibiotic medicine was prescribed, take it as directed. Make sure you finish it even if you start to feel better.  Do not have sex until treatment is completed.  Tell all sexual partners that you have a vaginal infection. They should see their health care provider and be treated if they have problems, such as a mild rash or itching.  Practice safe sex by using condoms and only having one sex partner. SEEK MEDICAL CARE IF:   Your   symptoms are not improving after 3 days of treatment.  You have increased discharge or pain.  You have a fever. MAKE SURE YOU:   Understand these instructions.  Will watch your condition.  Will get help right away if you are not doing well or get worse. FOR MORE INFORMATION  Centers for Disease Control  and Prevention, Division of STD Prevention: www.cdc.gov/std American Sexual Health Association (ASHA): www.ashastd.org  Document Released: 08/27/2005 Document Revised: 06/17/2013 Document Reviewed: 04/08/2013 ExitCare Patient Information 2014 ExitCare, LLC.  

## 2014-02-04 NOTE — Progress Notes (Signed)
Sherre Scarlet CNM notified of pt's concerns and complaints, states she will come and evaluate pt

## 2014-02-04 NOTE — MAU Note (Signed)
Patient states she has been having pain with urination for about 2 weeks. Having bilateral side pain since yesterday. Has a vaginal discharge with an odor.

## 2014-02-04 NOTE — MAU Note (Signed)
Pt doesn't think she could be pregnant.  LMP 01-25-14.  Pain and burning with urination x 2 weeks.  She states she is very sore and does notice some blood when whipping.  C/O smelly yellow, green, gray vaginal discharge.

## 2014-02-04 NOTE — MAU Provider Note (Signed)
History  18 yo P0 presents to MAU accompanied by mother w/ recurrent malodorous yellowish/grayish vaginal discharge. Was seen in MAU around the 10th of this month for same. In addition, reports lower abdominal pain, extreme burning on urination, itching, red and painful bumps on outer vaginal area, spotting only when she wipes and vaginal swelling.    Was treated for BV at last visit with Metrogel but did not complete course due to pain.  Endorses a h/o ?HSV, diagnosed by primary care provider for which she took a course of Acyclovir. States HSV neg w/ [facility].  LMP 01/25/14; normal cycle  Last intercourse: Prior to last MAU visit  Sexual partners: 1 since becoming symptomatic  No fever or vomiting  Some nausea; no appetite   There are no active problems to display for this patient.   Chief Complaint  Patient presents with  . Dysuria  . Vaginal Discharge   HPI  As above  OB History   Grav Para Term Preterm Abortions TAB SAB Ect Mult Living   1    1  1          Past Medical History  Diagnosis Date  . Herpes genitalis in women     Past Surgical History  Procedure Laterality Date  . Umbilical hernia repair      History reviewed. No pertinent family history.  History  Substance Use Topics  . Smoking status: Never Smoker   . Smokeless tobacco: Not on file  . Alcohol Use: No    Allergies: No Known Allergies  Prescriptions prior to admission  Medication Sig Dispense Refill  . acyclovir (ZOVIRAX) 200 MG capsule Take 400 mg by mouth 2 (two) times daily.         ROS Malodorous d/c Pelvic pain Dysuria Pain in vaginal area Loss of appetite Nausea  Physical Exam   Blood pressure 121/93, pulse 81, temperature 98.6 F (37 C), temperature source Oral, resp. rate 16, height 5' 3.5" (1.613 m), weight 172 lb 6.4 oz (78.2 kg), last menstrual period 12/26/2013, SpO2 99.00%.  Results for orders placed during the hospital encounter of 02/04/14 (from the past 24 hour(s))   URINALYSIS, ROUTINE W REFLEX MICROSCOPIC     Status: Abnormal   Collection Time    02/04/14 11:30 AM      Result Value Ref Range   Color, Urine YELLOW  YELLOW   APPearance CLEAR  CLEAR   Specific Gravity, Urine 1.025  1.005 - 1.030   pH 6.5  5.0 - 8.0   Glucose, UA NEGATIVE  NEGATIVE mg/dL   Hgb urine dipstick NEGATIVE  NEGATIVE   Bilirubin Urine NEGATIVE  NEGATIVE   Ketones, ur NEGATIVE  NEGATIVE mg/dL   Protein, ur NEGATIVE  NEGATIVE mg/dL   Urobilinogen, UA 0.2  0.0 - 1.0 mg/dL   Nitrite NEGATIVE  NEGATIVE   Leukocytes, UA MODERATE (*) NEGATIVE  URINE MICROSCOPIC-ADD ON     Status: Abnormal   Collection Time    02/04/14 11:30 AM      Result Value Ref Range   Squamous Epithelial / LPF FEW (*) RARE   WBC, UA 3-6  <3 WBC/hpf   RBC / HPF 0-2  <3 RBC/hpf   Bacteria, UA RARE  RARE   Urine-Other MUCOUS PRESENT    POCT PREGNANCY, URINE     Status: None   Collection Time    02/04/14 11:45 AM      Result Value Ref Range   Preg Test, Ur NEGATIVE  NEGATIVE  WET PREP, GENITAL     Status: Abnormal   Collection Time    02/04/14  1:41 PM      Result Value Ref Range   Yeast Wet Prep HPF POC MANY (*) NONE SEEN   Trich, Wet Prep NONE SEEN  NONE SEEN   Clue Cells Wet Prep HPF POC FEW (*) NONE SEEN   WBC, Wet Prep HPF POC MODERATE (*) NONE SEEN  GC/CHLAMYDIA PROBE AMP     Status: None   Collection Time    02/04/14  1:41 PM      Result Value Ref Range   CT Probe RNA NEGATIVE  NEGATIVE   GC Probe RNA NEGATIVE  NEGATIVE  RPR     Status: None   Collection Time    02/04/14  3:55 PM      Result Value Ref Range   RPR NON REAC  NON REAC  HEPATITIS B SURFACE ANTIGEN     Status: None   Collection Time    02/04/14  3:55 PM      Result Value Ref Range   Hepatitis B Surface Ag NEGATIVE  NEGATIVE  HEPATITIS C ANTIBODY     Status: None   Collection Time    02/04/14  3:55 PM      Result Value Ref Range   HCV Ab NEGATIVE  NEGATIVE  CBC WITH DIFFERENTIAL     Status: None   Collection Time     02/04/14  3:55 PM      Result Value Ref Range   WBC 8.6  4.0 - 10.5 K/uL   RBC 4.51  3.87 - 5.11 MIL/uL   Hemoglobin 12.8  12.0 - 15.0 g/dL   HCT 82.936.6  56.236.0 - 13.046.0 %   MCV 81.2  78.0 - 100.0 fL   MCH 28.4  26.0 - 34.0 pg   MCHC 35.0  30.0 - 36.0 g/dL   RDW 86.513.4  78.411.5 - 69.615.5 %   Platelets 328  150 - 400 K/uL   Neutrophils Relative % 53  43 - 77 %   Neutro Abs 4.5  1.7 - 7.7 K/uL   Lymphocytes Relative 38  12 - 46 %   Lymphs Abs 3.3  0.7 - 4.0 K/uL   Monocytes Relative 7  3 - 12 %   Monocytes Absolute 0.6  0.1 - 1.0 K/uL   Eosinophils Relative 2  0 - 5 %   Eosinophils Absolute 0.1  0.0 - 0.7 K/uL   Basophils Relative 0  0 - 1 %   Basophils Absolute 0.0  0.0 - 0.1 K/uL  HIV ANTIBODY (ROUTINE TESTING)     Status: None   Collection Time    02/04/14  3:55 PM      Result Value Ref Range   HIV 1&2 Ab, 4th Generation NONREACTIVE  NONREACTIVE   Pelvic u/s today:  Normal appearance of uterus and both ovaries. No pelvic mass or other significant abnormalities identified.   Physical Exam Gen: Apparent distress. Lungs: CTAB. CV: HRRR. Abdomen: soft, non-tender. Vagina: edematous, slightly red, no bumps, tender to palpation, bimanual exam w/ retroverted uterus and normal ovaries. Discharge: Abundant amount of thick, yellowish malodorous d/c; wet prep collected. Pelvic: Cervix w/o lesions, discharge or active bleeding - GC/CT obtained.  Labs: UPT UA/urine culture  CBC w/ diff RPR HBSAG Hep C AB HIV HSV-1&2 IGG AB glycoprotein               ED Course  Assessment: Neg UPT Normal  pelvic u/s BV Yeast vaginitis  Plan: Consulted Dr. Su Hilt. Toradol 30 mg IM given. One dose of Diflucan given in MAU; one tab sent to pt's Pharmacy. Metronidazole po; discussed importance of taking as prescribed - avoid alcohol and intercourse during treatment. Topical Lotrisone prn. Will contact pt w/ results of remaining labs per her request. F/U in office in 2 wks w/ Dr. Normand Sloop.  Pt  reported feeling better at the time of discharge.   Laiah Pouncey CNM, MS 02/04/2014 2:22 PM

## 2014-02-05 LAB — URINE CULTURE

## 2014-02-05 LAB — GC/CHLAMYDIA PROBE AMP
CT Probe RNA: NEGATIVE
GC PROBE AMP APTIMA: NEGATIVE

## 2014-02-08 LAB — HSV 2 ANTIBODY, IGG: HSV 2 Glycoprotein G Ab, IgG: <0.1 IV

## 2014-02-08 LAB — HSV 1 ANTIBODY, IGG

## 2014-05-31 ENCOUNTER — Encounter (HOSPITAL_COMMUNITY): Payer: Self-pay | Admitting: Emergency Medicine

## 2014-05-31 ENCOUNTER — Emergency Department (HOSPITAL_COMMUNITY)
Admission: EM | Admit: 2014-05-31 | Discharge: 2014-06-01 | Disposition: A | Discharge status: Home / Self Care | Payer: Managed Care, Other (non HMO) | Attending: Emergency Medicine | Admitting: Emergency Medicine

## 2014-05-31 DIAGNOSIS — A499 Bacterial infection, unspecified: Secondary | ICD-10-CM | POA: Diagnosis not present

## 2014-05-31 DIAGNOSIS — Z3202 Encounter for pregnancy test, result negative: Secondary | ICD-10-CM | POA: Diagnosis not present

## 2014-05-31 DIAGNOSIS — B9689 Other specified bacterial agents as the cause of diseases classified elsewhere: Secondary | ICD-10-CM | POA: Diagnosis not present

## 2014-05-31 DIAGNOSIS — K6289 Other specified diseases of anus and rectum: Secondary | ICD-10-CM | POA: Diagnosis not present

## 2014-05-31 DIAGNOSIS — IMO0002 Reserved for concepts with insufficient information to code with codable children: Secondary | ICD-10-CM | POA: Insufficient documentation

## 2014-05-31 DIAGNOSIS — N949 Unspecified condition associated with female genital organs and menstrual cycle: Secondary | ICD-10-CM | POA: Insufficient documentation

## 2014-05-31 DIAGNOSIS — N760 Acute vaginitis: Secondary | ICD-10-CM | POA: Insufficient documentation

## 2014-05-31 DIAGNOSIS — R300 Dysuria: Secondary | ICD-10-CM

## 2014-05-31 DIAGNOSIS — R102 Pelvic and perineal pain: Secondary | ICD-10-CM

## 2014-05-31 LAB — URINALYSIS, ROUTINE W REFLEX MICROSCOPIC
BILIRUBIN URINE: NEGATIVE
Glucose, UA: NEGATIVE mg/dL
HGB URINE DIPSTICK: NEGATIVE
Ketones, ur: NEGATIVE mg/dL
Leukocytes, UA: NEGATIVE
Nitrite: NEGATIVE
Protein, ur: NEGATIVE mg/dL
SPECIFIC GRAVITY, URINE: 1.017 (ref 1.005–1.030)
Urobilinogen, UA: 2 mg/dL — ABNORMAL HIGH (ref 0.0–1.0)
pH: 7.5 (ref 5.0–8.0)

## 2014-05-31 LAB — POC URINE PREG, ED: Preg Test, Ur: NEGATIVE

## 2014-05-31 NOTE — ED Notes (Signed)
Pt complains of vaginal pain for about one month, she states that it hurts after intercourse and consistently hurts during the day, she's two weeks late for her period and states that she has a little odor and a white discharge

## 2014-06-01 ENCOUNTER — Encounter (HOSPITAL_COMMUNITY): Payer: Self-pay | Admitting: Emergency Medicine

## 2014-06-01 LAB — WET PREP, GENITAL
TRICH WET PREP: NONE SEEN
WBC, Wet Prep HPF POC: NONE SEEN
YEAST WET PREP: NONE SEEN

## 2014-06-01 LAB — HIV ANTIBODY (ROUTINE TESTING W REFLEX): HIV 1&2 Ab, 4th Generation: NONREACTIVE

## 2014-06-01 LAB — RPR

## 2014-06-01 LAB — GC/CHLAMYDIA PROBE AMP
CT PROBE, AMP APTIMA: NEGATIVE
GC Probe RNA: NEGATIVE

## 2014-06-01 MED ORDER — IBUPROFEN 600 MG PO TABS: 600.0000 mg | ORAL_TABLET | Freq: Four times a day (QID) | ORAL | Status: DC | PRN

## 2014-06-01 MED ORDER — METRONIDAZOLE 500 MG PO TABS: 500.0000 mg | ORAL_TABLET | Freq: Two times a day (BID) | ORAL | Status: DC

## 2014-06-01 MED ORDER — TRAMADOL HCL 50 MG PO TABS: 50.0000 mg | ORAL_TABLET | Freq: Four times a day (QID) | ORAL | Status: DC | PRN

## 2014-06-01 NOTE — ED Provider Notes (Signed)
CSN: [id number]     Arrival date & time 05/31/14  2121 History   First MD Initiated Contact with Patient 06/01/14 0029     Chief Complaint  Patient presents with  . Vaginal Pain     (Consider location/radiation/quality/duration/timing/severity/associated sxs/prior Treatment) HPI  Patient with hx genital herpes and suspected endometriosis p/w vaginal pain, dyspareunia, abnormal white malodorous vaginal discharge x several weeks.  This has been an intermittent problem x months, recurrent BV and vaginal pain.  The symptoms are worse just after her period.  Also notes difficulty urinating and having bowel movements.  This has been ongoing for 3 years.  The BMs are constipated and she has rectal pain with BM, occasional bleeding with wiping or in her stool.  Was seen by gyn for this and miralax was recommended but pt has not done this.  She has also declined rectal exam previously and declines it today.  Has had rectal intercourse twice and bled both times.  Also notes dysuria x several weeks, described as pressure and urinary frequency.  LMP Aug 4.  Associated nausea.   Has one sexual partner. ObGyn is Dr Normand Sloop, [facility].  Has not seen her since February.    Past Medical History  Diagnosis Date  . Herpes genitalis in women    Past Surgical History  Procedure Laterality Date  . Umbilical hernia repair     History reviewed. No pertinent family history. History  Substance Use Topics  . Smoking status: Never Smoker   . Smokeless tobacco: Not on file  . Alcohol Use: No   OB History   Grav Para Term Preterm Abortions TAB SAB Ect Mult Living   0  0   1     Review of Systems  All other systems reviewed and are negative.     Allergies  Review of patient's allergies indicates no known allergies.  Home Medications   Prior to Admission medications   Not on File   BP 138/78  Pulse 80  Temp(Src) 98.8 F (37.1 C) (Oral)  Resp 16  Wt 172 lb (78.019 kg)  SpO2  98%  LMP 04/13/2014  Breastfeeding? Unknown Physical Exam  Nursing note and vitals reviewed. Constitutional: She appears well-developed and well-nourished. No distress.  HENT:  Head: Normocephalic and atraumatic.  Neck: Neck supple.  Cardiovascular: Normal rate and regular rhythm.   Pulmonary/Chest: Effort normal and breath sounds normal. No respiratory distress. She has no wheezes. She has no rales.  Abdominal: Soft. She exhibits no distension. There is no tenderness. There is no rebound and no guarding.  Genitourinary: Uterus is not tender. Cervix exhibits no motion tenderness. Right adnexum displays no mass, no tenderness and no fullness. Left adnexum displays no mass, no tenderness and no fullness. There is tenderness around the vagina. No erythema or bleeding around the vagina. No foreign body around the vagina. No signs of injury around the vagina. Vaginal discharge found.  Diffuse tenderness of vagina.  No specific cervical tenderness.  Small amount of thick white vaginal discharge.    Neurological: She is alert.  Skin: She is not diaphoretic.    ED Course  Procedures (including critical care time) Labs Review Labs Reviewed  URINALYSIS, ROUTINE W REFLEX MICROSCOPIC - Abnormal; Notable for the following:    Urobilinogen, UA 2.0 (*)    All other components within normal limits  GC/CHLAMYDIA PROBE AMP  WET PREP, GENITAL  URINE CULTURE  RPR  HIV ANTIBODY (ROUTINE TESTING)  POC URINE PREG, ED    Imaging Review No results found.   EKG Interpretation None      Pt declines rectal exam.   MDM   Final diagnoses:  Vaginal pain  Dyspareunia  Dysuria  Rectal pain   Afebrile, nontoxic patient with vaginal pain and abnormal vaginal discharge.  Also with dysuria and no menstrual period for 7 weeks.  Suspected by her gyn to have endometriosis.  Pregnancy test here is negative.  UA is unremarkable.  Urine culture sent.  STD panel sent.  Wet prep pending at change of shift     1:22 AM signed out to Dr Ranae Palms at change of shift who will f/u on wet prep results.  Discussed result, findings, treatment, and follow up  with patient.  Pt given return precautions.  Pt verbalizes understanding and agrees with plan.        [place], PA-C 06/01/14 [phone]

## 2014-06-01 NOTE — ED Provider Notes (Signed)
Medical screening examination/treatment/procedure(s) were performed by non-physician practitioner and as supervising physician I was immediately available for consultation/collaboration.   EKG Interpretation None        Grainger Mccarley, MD 06/01/14 2321 

## 2014-06-01 NOTE — Discharge Instructions (Signed)
Bacterial Vaginosis Bacterial vaginosis is a vaginal infection that occurs when the normal balance of bacteria in the vagina is disrupted. It results from an overgrowth of certain bacteria. This is the most common vaginal infection in women of childbearing age. Treatment is important to prevent complications, especially in pregnant women, as it can cause a premature delivery. CAUSES  Bacterial vaginosis is caused by an increase in harmful bacteria that are normally present in smaller amounts in the vagina. Several different kinds of bacteria can cause bacterial vaginosis. However, the reason that the condition develops is not fully understood. RISK FACTORS Certain activities or behaviors can put you at an increased risk of developing bacterial vaginosis, including:  Having a new sex partner or multiple sex partners.  Douching.  Using an intrauterine device (IUD) for contraception. Women do not get bacterial vaginosis from toilet seats, bedding, swimming pools, or contact with objects around them. SIGNS AND SYMPTOMS  Some women with bacterial vaginosis have no signs or symptoms. Common symptoms include:  Grey vaginal discharge.  A fishlike odor with discharge, especially after sexual intercourse.  Itching or burning of the vagina and vulva.  Burning or pain with urination. DIAGNOSIS  Your health care provider will take a medical history and examine the vagina for signs of bacterial vaginosis. A sample of vaginal fluid may be taken. Your health care provider will look at this sample under a microscope to check for bacteria and abnormal cells. A vaginal pH test may also be done.  TREATMENT  Bacterial vaginosis may be treated with antibiotic medicines. These may be given in the form of a pill or a vaginal cream. A second round of antibiotics may be prescribed if the condition comes back after treatment.  HOME CARE INSTRUCTIONS   Only take over-the-counter or prescription medicines as  directed by your health care provider.  If antibiotic medicine was prescribed, take it as directed. Make sure you finish it even if you start to feel better.  Do not have sex until treatment is completed.  Tell all sexual partners that you have a vaginal infection. They should see their health care provider and be treated if they have problems, such as a mild rash or itching.  Practice safe sex by using condoms and only having one sex partner. SEEK MEDICAL CARE IF:   Your symptoms are not improving after 3 days of treatment.  You have increased discharge or pain.  You have a fever. MAKE SURE YOU:   Understand these instructions.  Will watch your condition.  Will get help right away if you are not doing well or get worse. FOR MORE INFORMATION  Centers for Disease Control and Prevention, Division of STD Prevention: www.cdc.gov/std American Sexual Health Association (ASHA): www.ashastd.org  Document Released: 08/27/2005 Document Revised: 06/17/2013 Document Reviewed: 04/08/2013 [employer] Patient Information 2015 [employer]. This information is not intended to replace advice given to you by your health care provider. Make sure you discuss any questions you have with your health care provider.  

## 2014-06-02 LAB — URINE CULTURE
Colony Count: NO GROWTH
Culture: NO GROWTH

## 2014-06-11 ENCOUNTER — Inpatient Hospital Stay (HOSPITAL_COMMUNITY): Payer: Managed Care, Other (non HMO)

## 2014-06-11 ENCOUNTER — Inpatient Hospital Stay (HOSPITAL_COMMUNITY)
Admission: AD | Admit: 2014-06-11 | Discharge: 2014-06-11 | Disposition: A | Discharge status: Home / Self Care | Payer: Managed Care, Other (non HMO) | Source: Ambulatory Visit | Attending: Obstetrics and Gynecology | Admitting: Obstetrics and Gynecology

## 2014-06-11 ENCOUNTER — Encounter (HOSPITAL_COMMUNITY): Payer: Self-pay | Admitting: *Deleted

## 2014-06-11 DIAGNOSIS — N760 Acute vaginitis: Secondary | ICD-10-CM | POA: Diagnosis not present

## 2014-06-11 DIAGNOSIS — E663 Overweight: Secondary | ICD-10-CM

## 2014-06-11 DIAGNOSIS — N949 Unspecified condition associated with female genital organs and menstrual cycle: Secondary | ICD-10-CM | POA: Insufficient documentation

## 2014-06-11 DIAGNOSIS — R1031 Right lower quadrant pain: Secondary | ICD-10-CM | POA: Diagnosis not present

## 2014-06-11 LAB — CBC
HCT: 34.9 % — ABNORMAL LOW (ref 36.0–46.0)
HEMOGLOBIN: 12.4 g/dL (ref 12.0–15.0)
MCH: 28.3 pg (ref 26.0–34.0)
MCHC: 35.5 g/dL (ref 30.0–36.0)
MCV: 79.7 fL (ref 78.0–100.0)
PLATELETS: 344 10*3/uL (ref 150–400)
RBC: 4.38 MIL/uL (ref 3.87–5.11)
RDW: 13.3 % (ref 11.5–15.5)
WBC: 9.6 10*3/uL (ref 4.0–10.5)

## 2014-06-11 LAB — URINALYSIS, ROUTINE W REFLEX MICROSCOPIC
Bilirubin Urine: NEGATIVE
Glucose, UA: NEGATIVE mg/dL
Hgb urine dipstick: NEGATIVE
KETONES UR: 15 mg/dL — AB
Leukocytes, UA: NEGATIVE
NITRITE: NEGATIVE
PH: 6 (ref 5.0–8.0)
PROTEIN: NEGATIVE mg/dL
Specific Gravity, Urine: 1.025 (ref 1.005–1.030)
UROBILINOGEN UA: 0.2 mg/dL (ref 0.0–1.0)

## 2014-06-11 LAB — HCG, QUANTITATIVE, PREGNANCY: HCG, BETA CHAIN, QUANT, S: 8147 m[IU]/mL — AB (ref <5)

## 2014-06-11 LAB — WET PREP, GENITAL
Trich, Wet Prep: NONE SEEN
Yeast Wet Prep HPF POC: NONE SEEN

## 2014-06-11 MED ORDER — PRENATAL VITAMINS 28-0.8 MG PO TABS: 1.0000 | ORAL_TABLET | ORAL | Status: DC

## 2014-06-11 MED ORDER — METRONIDAZOLE 0.75 % VA GEL
1.0000 | Freq: Every day | VAGINAL | Status: DC
Start: 2014-06-11 — End: 2014-09-14

## 2014-06-11 NOTE — MAU Note (Addendum)
[facility] to [facility]. This am upt positive. Was taking pain med they gave me. Just want to know for sure if I am pregnant. Had some bleeding last THurs that was there and gone the next day. [facility]

## 2014-06-11 NOTE — MAU Provider Note (Signed)
History  18 yo G1P0 w/ uncertain LMP presents to MAU unannounced w/ c/o ongoing pelvic pain and spotting since last Sunday (9/20), which prompted an ED visit at [facility] on 06/01/14. Denies spotting on presentation. Wet prep in the ED showed BV. Pt was Rx'd po Metronidazole. States only took a few of the pills, last one taken last evening due to increased nausea. States was not aware Metronidazole was to be taken twice a day x 7 days. Pain and nausea prompted her to perform a UPT. LNMP was 04/13/14 per pt report. States +UPT this morning, yet neg on 06/01/14. Wants confirmatory testing. Unplanned pregnancy; FOB not involved at present.   Patient Active Problem List   Diagnosis Date Noted  . BV (bacterial vaginosis) 06/11/2014  . Overweight (BMI 25.0-29.9) 06/11/2014    Chief Complaint  Patient presents with  . Abdominal Pain   HPI See above OB History   Grav Para Term Preterm Abortions TAB SAB Ect Mult Living   1 0 0  0  0   1      Past Medical History  Diagnosis Date  . Herpes genitalis in women     Past Surgical History  Procedure Laterality Date  . Umbilical hernia repair      No family history on file.  History  Substance Use Topics  . Smoking status: Never Smoker   . Smokeless tobacco: Not on file  . Alcohol Use: No    Allergies: No Known Allergies  No prescriptions prior to admission    ROS See above  Physical Exam   CLINICAL DATA: Patient with pelvic pain. Positive pregnancy test.  EXAM:  OBSTETRIC <14 WK [place] AND TRANSVAGINAL OB US  TECHNIQUE:  Both transabdominal and transvaginal ultrasound examinations were  performed for complete evaluation of the gestation as well as the  maternal uterus, adnexal regions, and pelvic cul-de-sac.  Transvaginal technique was performed to assess early pregnancy.  COMPARISON: None.  FINDINGS:  Intrauterine gestational sac: Visualized/normal in shape.  Yolk sac: Probable.  Embryo: Not present  Cardiac Activity: Not present   MSD: 8.5 mm 5 w 3 d  Maternal uterus/adnexae: The right ovary measures 4.3 x 2.7 x 2.3  cm. There is a 1.9 x 1.6 x 1.6 cm right ovarian cyst. The left ovary  measures 2.2 x 1.2 x 1.4 cm. There is a small subchorionic  hemorrhage.  IMPRESSION:  Probable early intrauterine gestational sac and yolk sac without  fetal pole or cardiac activity yet visualized. Recommend follow-up  quantitative B-HCG levels and follow-up US in 14 days to confirm and  assess viability. This recommendation follows SRU consensus  guidelines: Diagnostic Criteria for Nonviable Pregnancy Early in the  First Trimester. Malva Limes Med 2013; 130:8657-84.  Electronically Signed  By: Annia Belt M.D.  On: 06/11/2014 22:01   Results for orders placed during the hospital encounter of 06/11/14 (from the past 24 hour(s))  CBC     Status: Abnormal   Collection Time    06/11/14  8:10 PM      Result Value Ref Range   WBC 9.6  4.0 - 10.5 K/uL   RBC 4.38  3.87 - 5.11 MIL/uL   Hemoglobin 12.4  12.0 - 15.0 g/dL   HCT 69.6 (*) 29.5 - 28.4 %   MCV 79.7  78.0 - 100.0 fL   MCH 28.3  26.0 - 34.0 pg   MCHC 35.5  30.0 - 36.0 g/dL   RDW 13.2  44.0 - 10.2 %  Platelets 344  150 - 400 K/uL  HCG, QUANTITATIVE, PREGNANCY     Status: Abnormal   Collection Time    06/11/14  8:10 PM      Result Value Ref Range   hCG, Beta Chain, Quant, S 8147 (*) <5 mIU/mL  URINALYSIS, ROUTINE W REFLEX MICROSCOPIC     Status: Abnormal   Collection Time    06/11/14  8:15 PM      Result Value Ref Range   Color, Urine YELLOW  YELLOW   APPearance CLEAR  CLEAR   Specific Gravity, Urine 1.025  1.005 - 1.030   pH 6.0  5.0 - 8.0   Glucose, UA NEGATIVE  NEGATIVE mg/dL   Hgb urine dipstick NEGATIVE  NEGATIVE   Bilirubin Urine NEGATIVE  NEGATIVE   Ketones, ur 15 (*) NEGATIVE mg/dL   Protein, ur NEGATIVE  NEGATIVE mg/dL   Urobilinogen, UA 0.2  0.0 - 1.0 mg/dL   Nitrite NEGATIVE  NEGATIVE   Leukocytes, UA NEGATIVE  NEGATIVE  WET PREP, GENITAL     Status:  Abnormal   Collection Time    06/11/14  8:50 PM      Result Value Ref Range   Yeast Wet Prep HPF POC NONE SEEN  NONE SEEN   Trich, Wet Prep NONE SEEN  NONE SEEN   Clue Cells Wet Prep HPF POC FEW (*) NONE SEEN   WBC, Wet Prep HPF POC FEW (*) NONE SEEN    Blood pressure 122/68, pulse 77, temperature 98.6 F (37 C), resp. rate 18, height 5\' 4"  (1.626 m), weight 175 lb 12.8 oz (79.742 kg), last menstrual period 04/13/2014, unknown if currently breastfeeding.  Physical Exam Gen: NAD Abd: soft, tender Pelvic: No active bleeding. Scant amount of non odorous discharge present. Sample taken for wet prep and GC/CT. Cvx closed. Neg CMT. Bimanual reveals right lower quadrant pain  ED Course  Assessment: Quants 8147 BV "Probable early intrauterine gestational sac and yolk sac without  fetal pole or cardiac activity yet visualized."   Plan: Discontinue Toradol and Ibuprofen. Begin PNVs (ordered); may take Tylenol prn Metrogel Per Radiologist, "recommend follow-up quantitative B-HCG levels and follow-up US in 14 days to confirm and  assess viability" [facility] for repeat quants and ABO/Rh; to have f/u u/s sooner if deemed appropriate by consulting MD.      Sherre ScarletWILLIAMS, Sheryn Aldaz CNM, MS 06/11/14 @ 9:12 PM

## 2014-06-11 NOTE — Discharge Instructions (Signed)
Ovarian Cyst An ovarian cyst is a sac filled with fluid or blood. This sac is attached to the ovary. Some cysts go away on their own. Other cysts need treatment.  HOME CARE   Only take medicine as told by your doctor.  Follow up with your doctor as told.  Get regular pelvic exams and Pap tests. GET HELP IF:  Your periods are late, not regular, or painful.  You stop having periods.  Your belly (abdominal) or pelvic pain does not go away. First Trimester of Pregnancy The first trimester of pregnancy is from week 1 until the end of week 12 (months 1 through 3). A week after a sperm fertilizes an egg, the egg will implant on the wall of the uterus. This embryo will begin to develop into a baby. Genes from you and your partner are forming the baby. The female genes determine whether the baby is a boy or a girl. At 6-8 weeks, the eyes and face are formed, and the heartbeat can be seen on ultrasound. At the end of 12 weeks, all the baby's organs are formed.  Now that you are pregnant, you will want to do everything you can to have a healthy baby. Two of the most important things are to get good prenatal care and to follow your health care provider's instructions. Prenatal care is all the medical care you receive before the baby's birth. This care will help prevent, find, and treat any problems during the pregnancy and childbirth. BODY CHANGES Your body goes through many changes during pregnancy. The changes vary from woman to woman.  You may gain or lose a couple of pounds at first. You may feel sick to your stomach (nauseous) and throw up (vomit). If the vomiting is uncontrollable, call your health care provider. You may tire easily. You may develop headaches that can be relieved by medicines approved by your health care provider. You may urinate more often. Painful urination may mean you have a bladder infection. You may develop heartburn as a result of your pregnancy. You may develop  constipation because certain hormones are causing the muscles that push waste through your intestines to slow down. You may develop hemorrhoids or swollen, bulging veins (varicose veins). Your breasts may begin to grow larger and become tender. Your nipples may stick out more, and the tissue that surrounds them (areola) may become darker. Your gums may bleed and may be sensitive to brushing and flossing. Dark spots or blotches (chloasma, mask of pregnancy) may develop on your face. This will likely fade after the baby is born. Your menstrual periods will stop. You may have a loss of appetite. You may develop cravings for certain kinds of food. You may have changes in your emotions from day to day, such as being excited to be pregnant or being concerned that something may go wrong with the pregnancy and baby. You may have more vivid and strange dreams. You may have changes in your hair. These can include thickening of your hair, rapid growth, and changes in texture. Some women also have hair loss during or after pregnancy, or hair that feels dry or thin. Your hair will most likely return to normal after your baby is born. WHAT TO EXPECT AT YOUR PRENATAL VISITS During a routine prenatal visit: You will be weighed to make sure you and the baby are growing normally. Your blood pressure will be taken. Your abdomen will be measured to track your baby's growth. The fetal heartbeat will be  listened to starting around week 10 or 12 of your pregnancy. Test results from any previous visits will be discussed. Your health care provider may ask you: How you are feeling. If you are feeling the baby move. If you have had any abnormal symptoms, such as leaking fluid, bleeding, severe headaches, or abdominal cramping. If you have any questions. Other tests that may be performed during your first trimester include: Blood tests to find your blood type and to check for the presence of any previous infections.  They will also be used to check for low iron levels (anemia) and Rh antibodies. Later in the pregnancy, blood tests for diabetes will be done along with other tests if problems develop. Urine tests to check for infections, diabetes, or protein in the urine. An ultrasound to confirm the proper growth and development of the baby. An amniocentesis to check for possible genetic problems. Fetal screens for spina bifida and Down syndrome. You may need other tests to make sure you and the baby are doing well. HOME CARE INSTRUCTIONS  Medicines Follow your health care provider's instructions regarding medicine use. Specific medicines may be either safe or unsafe to take during pregnancy. Take your prenatal vitamins as directed. If you develop constipation, try taking a stool softener if your health care provider approves. Diet Eat regular, well-balanced meals. Choose a variety of foods, such as meat or vegetable-based protein, fish, milk and low-fat dairy products, vegetables, fruits, and whole grain breads and cereals. Your health care provider will help you determine the amount of weight gain that is right for you. Avoid raw meat and uncooked cheese. These carry germs that can cause birth defects in the baby. Eating four or five small meals rather than three large meals a day may help relieve nausea and vomiting. If you start to feel nauseous, eating a few soda crackers can be helpful. Drinking liquids between meals instead of during meals also seems to help nausea and vomiting. If you develop constipation, eat more high-fiber foods, such as fresh vegetables or fruit and whole grains. Drink enough fluids to keep your urine clear or pale yellow. Activity and Exercise Exercise only as directed by your health care provider. Exercising will help you: Control your weight. Stay in shape. Be prepared for labor and delivery. Experiencing pain or cramping in the lower abdomen or low back is a good sign that you  should stop exercising. Check with your health care provider before continuing normal exercises. Try to avoid standing for long periods of time. Move your legs often if you must stand in one place for a long time. Avoid heavy lifting. Wear low-heeled shoes, and practice good posture. You may continue to have sex unless your health care provider directs you otherwise. Relief of Pain or Discomfort Wear a good support bra for breast tenderness.  Take warm sitz baths to soothe any pain or discomfort caused by hemorrhoids. Use hemorrhoid cream if your health care provider approves.  Rest with your legs elevated if you have leg cramps or low back pain. If you develop varicose veins in your legs, wear support hose. Elevate your feet for 15 minutes, 3-4 times a day. Limit salt in your diet. Prenatal Care Schedule your prenatal visits by the twelfth week of pregnancy. They are usually scheduled monthly at first, then more often in the last 2 months before delivery. Write down your questions. Take them to your prenatal visits. Keep all your prenatal visits as directed by your health care provider.  Safety Wear your seat belt at all times when driving. Make a list of emergency phone numbers, including numbers for family, friends, the hospital, and police and fire departments. General Tips Ask your health care provider for a referral to a local prenatal education class. Begin classes no later than at the beginning of month 6 of your pregnancy. Ask for help if you have counseling or nutritional needs during pregnancy. Your health care provider can offer advice or refer you to specialists for help with various needs. Do not use hot tubs, steam rooms, or saunas. Do not douche or use tampons or scented sanitary pads. Do not cross your legs for long periods of time. Avoid cat litter boxes and soil used by cats. These carry germs that can cause birth defects in the baby and possibly loss of the fetus by  miscarriage or stillbirth. Avoid all smoking, herbs, alcohol, and medicines not prescribed by your health care provider. Chemicals in these affect the formation and growth of the baby. Schedule a dentist appointment. At home, brush your teeth with a soft toothbrush and be gentle when you floss. SEEK MEDICAL CARE IF:  You have dizziness. You have mild pelvic cramps, pelvic pressure, or nagging pain in the abdominal area. You have persistent nausea, vomiting, or diarrhea. You have a bad smelling vaginal discharge. You have pain with urination. You notice increased swelling in your face, hands, legs, or ankles. SEEK IMMEDIATE MEDICAL CARE IF:  You have a fever. You are leaking fluid from your vagina. You have spotting or bleeding from your vagina. You have severe abdominal cramping or pain. You have rapid weight gain or loss. You vomit blood or material that looks like coffee grounds. You are exposed to [place] measles and have never had them. You are exposed to fifth disease or chickenpox. You develop a severe headache. You have shortness of breath. You have any kind of trauma, such as from a fall or a car accident. Document Released: 08/21/2001 Document Revised: 01/11/2014 Document Reviewed: 07/07/2013 [facility] Patient Information 2015 [place], [place]. This information is not intended to replace advice given to you by your health care provider. Make sure you discuss any questions you have with your health care provider.    Your belly becomes large or puffy (swollen).  You have a hard time peeing (totally emptying your bladder).  You have pressure on your bladder.  You have pain during sex.  You feel fullness, pressure, or discomfort in your belly.  You lose weight for no reason.  You feel sick most of the time.  You have a hard time pooping (constipation).  You do not feel like eating.  You develop pimples (acne).  You have an increase in hair on your body and  face.  You are gaining weight for no reason.  You think you are pregnant. GET HELP RIGHT AWAY IF:   Your belly pain gets worse.  You feel sick to your stomach (nauseous), and you throw up (vomit).  You have a fever that comes on fast.  You have belly pain while pooping (bowel movement).  Your periods are heavier than usual. MAKE SURE YOU:   Understand these instructions.  Will watch your condition.  Will get help right away if you are not doing well or get worse. Document Released: 02/13/2008 Document Revised: 06/17/2013 Document Reviewed: 05/04/2013 [facility] Patient Information 2015 [place], [place]. This information is not intended to replace advice given to you by your health care provider. Make sure you discuss  any questions you have with your health care provider. ° °

## 2014-06-12 LAB — GC/CHLAMYDIA PROBE AMP
CT Probe RNA: NEGATIVE
GC Probe RNA: NEGATIVE

## 2014-06-15 ENCOUNTER — Inpatient Hospital Stay (HOSPITAL_COMMUNITY)
Admission: AD | Admit: 2014-06-15 | Discharge: 2014-06-15 | Disposition: A | Discharge status: Home / Self Care | Payer: Managed Care, Other (non HMO) | Source: Ambulatory Visit | Attending: Obstetrics and Gynecology | Admitting: Obstetrics and Gynecology

## 2014-06-15 ENCOUNTER — Ambulatory Visit (HOSPITAL_COMMUNITY)
Admission: AD | Admit: 2014-06-15 | Discharge: 2014-06-15 | Disposition: A | Discharge status: Home / Self Care | Payer: Managed Care, Other (non HMO) | Source: Ambulatory Visit | Attending: Obstetrics and Gynecology | Admitting: Obstetrics and Gynecology

## 2014-06-15 DIAGNOSIS — O26851 Spotting complicating pregnancy, first trimester: Secondary | ICD-10-CM | POA: Diagnosis not present

## 2014-06-15 DIAGNOSIS — Z09 Encounter for follow-up examination after completed treatment for conditions other than malignant neoplasm: Secondary | ICD-10-CM | POA: Insufficient documentation

## 2014-06-15 DIAGNOSIS — Z3A00 Weeks of gestation of pregnancy not specified: Secondary | ICD-10-CM | POA: Insufficient documentation

## 2014-06-15 LAB — ABO/RH: ABO/RH(D): O POS

## 2014-06-15 LAB — HCG, QUANTITATIVE, PREGNANCY: HCG, BETA CHAIN, QUANT, S: 16197 m[IU]/mL — AB (ref <5)

## 2014-06-15 NOTE — MAU Note (Signed)
Pt for outpatient labs only per V. Standard CNM.  Did not need to be registered to MAU.

## 2014-06-21 LAB — OB RESULTS CONSOLE ABO/RH: RH Type: POSITIVE

## 2014-06-21 LAB — OB RESULTS CONSOLE GC/CHLAMYDIA
CHLAMYDIA, DNA PROBE: NEGATIVE
Gonorrhea: NEGATIVE

## 2014-06-21 LAB — OB RESULTS CONSOLE RUBELLA ANTIBODY, IGM: Rubella: IMMUNE

## 2014-06-21 LAB — OB RESULTS CONSOLE HEPATITIS B SURFACE ANTIGEN: Hepatitis B Surface Ag: NEGATIVE

## 2014-06-21 LAB — OB RESULTS CONSOLE RPR: RPR: NONREACTIVE

## 2014-06-21 LAB — OB RESULTS CONSOLE HIV ANTIBODY (ROUTINE TESTING): HIV: NONREACTIVE

## 2014-06-21 LAB — OB RESULTS CONSOLE ANTIBODY SCREEN: ANTIBODY SCREEN: NEGATIVE

## 2014-07-12 ENCOUNTER — Encounter (HOSPITAL_COMMUNITY): Payer: Self-pay | Admitting: *Deleted

## 2014-09-10 NOTE — L&D Delivery Note (Signed)
Delivery Note I was called into the room for concern of fetal status. Prolonged decelerations noted with pushing. When I arrived patient's resumed pushing and decelerations were noted. Patient was offered vacuum assistance and she agreed however she proceeded to push very effectively and did not require a vacuum assistance.   At 3:09 AM a viable female was delivered via Vaginal, Spontaneous Delivery (Presentation: Right Occiput Anterior).  APGAR: 8 ; 9weight 6lb 14 oz .   Placenta status: Intact, Spontaneous.  Cord: 3 vessels with the following complications: None.   Anesthesia: Epidural  Episiotomy: None Lacerations: Bilateral Labial Suture Repair: 3.0 vicryl Est. Blood Loss (mL): 298  Mom to postpartum.  Baby to Couplet care / Skin to Skin.  [facility] Center[facility]UL[facility],Tasha Chen 02/16/2015, 4:17 AM

## 2014-09-14 ENCOUNTER — Encounter (HOSPITAL_COMMUNITY): Payer: Self-pay | Admitting: *Deleted

## 2014-09-14 ENCOUNTER — Inpatient Hospital Stay (HOSPITAL_COMMUNITY)
Admission: AD | Admit: 2014-09-14 | Discharge: 2014-09-14 | Disposition: A | Discharge status: Home / Self Care | Payer: Managed Care, Other (non HMO) | Source: Ambulatory Visit | Attending: Obstetrics and Gynecology | Admitting: Obstetrics and Gynecology

## 2014-09-14 DIAGNOSIS — Z3A18 18 weeks gestation of pregnancy: Secondary | ICD-10-CM | POA: Diagnosis not present

## 2014-09-14 DIAGNOSIS — N9089 Other specified noninflammatory disorders of vulva and perineum: Secondary | ICD-10-CM | POA: Diagnosis present

## 2014-09-14 DIAGNOSIS — O9989 Other specified diseases and conditions complicating pregnancy, childbirth and the puerperium: Secondary | ICD-10-CM | POA: Diagnosis not present

## 2014-09-14 LAB — URINALYSIS, ROUTINE W REFLEX MICROSCOPIC
BILIRUBIN URINE: NEGATIVE
Glucose, UA: NEGATIVE mg/dL
Hgb urine dipstick: NEGATIVE
KETONES UR: NEGATIVE mg/dL
Leukocytes, UA: NEGATIVE
Nitrite: NEGATIVE
PH: 6 (ref 5.0–8.0)
Protein, ur: NEGATIVE mg/dL
Specific Gravity, Urine: >1.03 — ABNORMAL HIGH (ref 1.005–1.030)
Urobilinogen, UA: 0.2 mg/dL (ref 0.0–1.0)

## 2014-09-14 LAB — WET PREP, GENITAL
Clue Cells Wet Prep HPF POC: NONE SEEN
Trich, Wet Prep: NONE SEEN
Yeast Wet Prep HPF POC: NONE SEEN

## 2014-09-14 MED ORDER — NYSTATIN-TRIAMCINOLONE 100000-0.1 UNIT/GM-% EX OINT: 1.0000 "application " | TOPICAL_OINTMENT | Freq: Two times a day (BID) | CUTANEOUS | Status: DC

## 2014-09-14 NOTE — MAU Note (Signed)
Last Friday started experiencing vaginal burning, away over weekend, came back last night.  Is having pain and burning with urination.  Seeing grayish d/c.

## 2014-09-14 NOTE — Discharge Instructions (Signed)

## 2014-09-14 NOTE — MAU Provider Note (Signed)
History   19 yo G2P0010 at 5 6/7 weeks presented unannounced c/o vulvar irritation and burning.  Denies true dysuria, but "pee burns" when it hits perineum.  Notes "gray" d/c, denies odor.  Has had recurring issue with this even prior to pregnancy--hx ? HSV dx in past, but negative titers at [facility] per patient report.  Denies STD risk, but does admit to hx of anal sex.  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  Patient Active Problem List   Diagnosis Date Noted  . BV (bacterial vaginosis) 06/11/2014  . Overweight (BMI 25.0-29.9) 06/11/2014    Chief Complaint  Patient presents with  . Vaginal Discharge   HPI:  As above  OB History    Gravida Para Term Preterm AB TAB SAB Ectopic Multiple Living   1 0 0  0  0   0      No past medical history on file.  Past Surgical History  Procedure Laterality Date  . Umbilical hernia repair      No family history on file.  History  Substance Use Topics  . Smoking status: Never Smoker   . Smokeless tobacco: Not on file  . Alcohol Use: No    Allergies: No Known Allergies  Prescriptions prior to admission  Medication Sig Dispense Refill Last Dose  . metroNIDAZOLE (METROGEL VAGINAL) 0.75 % vaginal gel Place 1 Applicatorful vaginally at bedtime. For 5 nights 70 g 0   . Prenatal Vit-Fe Fumarate-FA (PRENATAL VITAMINS) 28-0.8 MG TABS Take 1 tablet by mouth daily. 100 tablet 1     ROS:  Vaginal irritation/burning, d/c Physical Exam   Blood pressure 124/58, pulse 79, temperature 99 F (37.2 C), temperature source Oral, resp. rate 18, weight 177 lb (80.287 kg), last menstrual period 04/13/2014, unknown if currently breastfeeding.  Physical Exam  In NAD Chest clear Heart RRR without murmur Abd gravid, NT Pelvic--cervix closed, small amount adherent white d/c on external perineum and in vagina, perineal body erythematous.  No lesions noted. Ext WNL  ED Course  Assessment: IUP at 18 6/7 weeks Vulvar  irritation  Plan: Wet prep UA GC/chlamydia on urine.   Nigel Bridgeman CNM, MSN 09/14/2014 6:38 PM  Addendum: Results for orders placed or performed during the hospital encounter of 09/14/14 (from the past 24 hour(s))  Urinalysis, Routine w reflex microscopic     Status: Abnormal   Collection Time: 09/14/14  5:30 PM  Result Value Ref Range   Color, Urine YELLOW YELLOW   APPearance CLEAR CLEAR   Specific Gravity, Urine >1.030 (H) 1.005 - 1.030   pH 6.0 5.0 - 8.0   Glucose, UA NEGATIVE NEGATIVE mg/dL   Hgb urine dipstick NEGATIVE NEGATIVE   Bilirubin Urine NEGATIVE NEGATIVE   Ketones, ur NEGATIVE NEGATIVE mg/dL   Protein, ur NEGATIVE NEGATIVE mg/dL   Urobilinogen, UA 0.2 0.0 - 1.0 mg/dL   Nitrite NEGATIVE NEGATIVE   Leukocytes, UA NEGATIVE NEGATIVE  Wet prep, genital     Status: Abnormal   Collection Time: 09/14/14  6:16 PM  Result Value Ref Range   Yeast Wet Prep HPF POC NONE SEEN NONE SEEN   Trich, Wet Prep NONE SEEN NONE SEEN   Clue Cells Wet Prep HPF POC NONE SEEN NONE SEEN   WBC, Wet Prep HPF POC FEW (A) NONE SEEN   Impression: Vulvar irritation  Plan: Rx Mycolog cream for prn use. Vulvar hygiene reviewed. Keep scheduled appt at [facility] in 3 weeks, or call prn. GC/chlamydia  pending from urine.  Leander RamsVicki Lattham, CNM 09/14/14 7p

## 2014-09-14 NOTE — MAU Note (Signed)
Pt states she has been having burning in her bottom when urinating.

## 2014-09-15 LAB — GC/CHLAMYDIA PROBE AMP
CT PROBE, AMP APTIMA: NEGATIVE
GC Probe RNA: NEGATIVE

## 2014-10-22 ENCOUNTER — Encounter (HOSPITAL_COMMUNITY): Payer: Self-pay | Admitting: *Deleted

## 2014-10-22 ENCOUNTER — Inpatient Hospital Stay (HOSPITAL_COMMUNITY)
Admission: AD | Admit: 2014-10-22 | Discharge: 2014-10-22 | Disposition: A | Discharge status: Home / Self Care | Payer: Managed Care, Other (non HMO) | Source: Ambulatory Visit | Attending: Obstetrics & Gynecology | Admitting: Obstetrics & Gynecology

## 2014-10-22 DIAGNOSIS — R1032 Left lower quadrant pain: Secondary | ICD-10-CM | POA: Diagnosis present

## 2014-10-22 DIAGNOSIS — N949 Unspecified condition associated with female genital organs and menstrual cycle: Secondary | ICD-10-CM | POA: Insufficient documentation

## 2014-10-22 DIAGNOSIS — Z3A24 24 weeks gestation of pregnancy: Secondary | ICD-10-CM | POA: Insufficient documentation

## 2014-10-22 DIAGNOSIS — O9989 Other specified diseases and conditions complicating pregnancy, childbirth and the puerperium: Secondary | ICD-10-CM | POA: Insufficient documentation

## 2014-10-22 LAB — URINALYSIS, ROUTINE W REFLEX MICROSCOPIC
Bilirubin Urine: NEGATIVE
Glucose, UA: NEGATIVE mg/dL
HGB URINE DIPSTICK: NEGATIVE
Ketones, ur: NEGATIVE mg/dL
Leukocytes, UA: NEGATIVE
NITRITE: NEGATIVE
Protein, ur: NEGATIVE mg/dL
Specific Gravity, Urine: 1.025 (ref 1.005–1.030)
UROBILINOGEN UA: 0.2 mg/dL (ref 0.0–1.0)
pH: 6.5 (ref 5.0–8.0)

## 2014-10-22 LAB — WET PREP, GENITAL
Clue Cells Wet Prep HPF POC: NONE SEEN
TRICH WET PREP: NONE SEEN
YEAST WET PREP: NONE SEEN

## 2014-10-22 MED ORDER — ACETAMINOPHEN 500 MG PO TABS
1000.0000 mg | ORAL_TABLET | Freq: Once | ORAL | Status: AC
  Administered 2014-10-22: 1000 mg via ORAL
  Filled 2014-10-22: qty 2

## 2014-10-22 NOTE — MAU Provider Note (Signed)
History    Tasha Chen is a 19 y.o. G1P0 at 24.2wks who presents, after phone call, for left lower abdominal pain.  Patient states pain is only present with ambulation and has been ongoing all day.  However, patient states pain worsened around 8pm.  Patient instructed, by provider prior to arrival, to take tylenol but states she did not because she just "felt she needed to get here."  Patient denies issues with constipation, urination, or recent illness.  Patient reports active fetus and denies vb, change in discharge, or lof.  Patient reports last sexual intercourse 2 weeks ago.    Patient Active Problem List   Diagnosis Date Noted  . Vulvar irritation 09/14/2014  . BV (bacterial vaginosis) 06/11/2014  . Overweight (BMI 25.0-29.9) 06/11/2014    No chief complaint on file.  HPI  OB History    Gravida Para Term Preterm AB TAB SAB Ectopic Multiple Living   1 0 0  0  0   0      History reviewed. No pertinent past medical history.  Past Surgical History  Procedure Laterality Date  . Umbilical hernia repair      History reviewed. No pertinent family history.  History  Substance Use Topics  . Smoking status: Never Smoker   . Smokeless tobacco: Not on file  . Alcohol Use: No    Allergies: No Known Allergies  Prescriptions prior to admission  Medication Sig Dispense Refill Last Dose  . Prenatal Vit-Fe Fumarate-FA (PRENATAL VITAMINS) 28-0.8 MG TABS Take 1 tablet by mouth daily. 100 tablet 1 10/22/2014 at Unknown time  . nystatin-triamcinolone ointment (MYCOLOG) Apply 1 application topically 2 (two) times daily. 30 g 2 More than a month at Unknown time    ROS  See HPI Above Physical Exam   Blood pressure 129/68, pulse 76, temperature 98.7 F (37.1 C), temperature source Oral, resp. rate 16, height  (1.626 m), weight 184 lb (83.462 kg), last menstrual period 04/13/2014, SpO2 100 %, unknown if currently breastfeeding.  Results for orders placed or performed during the  hospital encounter of 10/22/14 (from the past 24 hour(s))  Urinalysis, Routine w reflex microscopic     Status: None   Collection Time: 10/22/14  1:10 AM  Result Value Ref Range   Color, Urine YELLOW YELLOW   APPearance CLEAR CLEAR   Specific Gravity, Urine 1.025 1.005 - 1.030   pH 6.5 5.0 - 8.0   Glucose, UA NEGATIVE NEGATIVE mg/dL   Hgb urine dipstick NEGATIVE NEGATIVE   Bilirubin Urine NEGATIVE NEGATIVE   Ketones, ur NEGATIVE NEGATIVE mg/dL   Protein, ur NEGATIVE NEGATIVE mg/dL   Urobilinogen, UA 0.2 0.0 - 1.0 mg/dL   Nitrite NEGATIVE NEGATIVE   Leukocytes, UA NEGATIVE NEGATIVE  Wet prep, genital     Status: Abnormal   Collection Time: 10/22/14  2:55 AM  Result Value Ref Range   Yeast Wet Prep HPF POC NONE SEEN NONE SEEN   Trich, Wet Prep NONE SEEN NONE SEEN   Clue Cells Wet Prep HPF POC NONE SEEN NONE SEEN   WBC, Wet Prep HPF POC FEW (A) NONE SEEN     Physical Exam  Constitutional: She is oriented to person, place, and time. She appears well-developed and well-nourished.  HENT:  Head: Normocephalic and atraumatic.  Eyes: EOM are normal.  Neck: Normal range of motion.  Cardiovascular: Normal rate, regular rhythm and normal heart sounds.   Respiratory: Effort normal and breath sounds normal.  GI: Soft. Bowel sounds are  normal. She exhibits no mass. There is tenderness. There is no guarding.  Genitourinary: Cervix exhibits motion tenderness (.Unable to differentiate between pain or pressure). Vaginal discharge found.  Sterile Speculum Exam: -Vaginal Vault:Scant amt thin white discharge-wet prep collected -Cervix: Appears closed, no discharge  Bimanual Exam: Closed/Long/Thick/Ballotable  Musculoskeletal: Normal range of motion.  Neurological: She is alert and oriented to person, place, and time.  Skin: Skin is warm and dry.   FHR: 140 bpm, Mod Var, -Decels, +Accels UC: None graphed or palpated ED Course  Assessment: IUP at 24.2wks Round Ligament  Pain  Plan: -Educated on pregnancy related changes including round ligament and back pain -Advised to get belly support band or pregnancy belt -Expressed desire to start prenatal yoga for back pain, discouraged as patient has never exercised, advised to start in PP period -Offered and accepted tylenol ex for pain  Follow Up (0255) -In room to assess pain s/p tylenol -Patient states pain is located in vaginal area, which was not disclosed earlier -Pelvic exam and wet prep collected -Gc/Ct added on to urine specimen-results pending  Follow Up (0345) -Wet prep negative -Results given to patient, discussed procedure for f/u, if positive gc/ct -No other questions, concerns, or complaints -Keep appt as scheduled: 10/28/2014 -Encouraged to call if any questions or concerns arise prior to next scheduled office visit.  -Discharged to home in stable condition  Markeise Mathews LYNN CNM, MSN 10/22/2014 1:41 AM

## 2014-10-22 NOTE — MAU Note (Signed)
Pt reports she has had bad pelvic pain with walking x one week. Worsening today and now she has pain in her left lower abd. Denies bleeding.

## 2014-10-22 NOTE — Discharge Instructions (Signed)
Second Trimester of Pregnancy The second trimester is from week 13 through week 28, months 4 through 6. The second trimester is often a time when you feel your best. Your body has also adjusted to being pregnant, and you begin to feel better physically. Usually, morning sickness has lessened or quit completely, you may have more energy, and you may have an increase in appetite. The second trimester is also a time when the fetus is growing rapidly. At the end of the sixth month, the fetus is about 9 inches long and weighs about 1 pounds. You will likely begin to feel the baby move (quickening) between 18 and 20 weeks of the pregnancy. BODY CHANGES Your body goes through many changes during pregnancy. The changes vary from woman to woman.   Your weight will continue to increase. You will notice your lower abdomen bulging out.  You may begin to get stretch marks on your hips, abdomen, and breasts.  You may develop headaches that can be relieved by medicines approved by your health care provider.  You may urinate more often because the fetus is pressing on your bladder.  You may develop or continue to have heartburn as a result of your pregnancy.  You may develop constipation because certain hormones are causing the muscles that push waste through your intestines to slow down.  You may develop hemorrhoids or swollen, bulging veins (varicose veins).  You may have back pain because of the weight gain and pregnancy hormones relaxing your joints between the bones in your pelvis and as a result of a shift in weight and the muscles that support your balance.  Your breasts will continue to grow and be tender.  Your gums may bleed and may be sensitive to brushing and flossing.  Dark spots or blotches (chloasma, mask of pregnancy) may develop on your face. This will likely fade after the baby is born.  A dark line from your belly button to the pubic area (linea nigra) may appear. This will likely fade  after the baby is born.  You may have changes in your hair. These can include thickening of your hair, rapid growth, and changes in texture. Some women also have hair loss during or after pregnancy, or hair that feels dry or thin. Your hair will most likely return to normal after your baby is born. WHAT TO EXPECT AT YOUR PRENATAL VISITS During a routine prenatal visit:  You will be weighed to make sure you and the fetus are growing normally.  Your blood pressure will be taken.  Your abdomen will be measured to track your baby's growth.  The fetal heartbeat will be listened to.  Any test results from the previous visit will be discussed. Your health care provider may ask you:  How you are feeling.  If you are feeling the baby move.  If you have had any abnormal symptoms, such as leaking fluid, bleeding, severe headaches, or abdominal cramping.  If you have any questions. Other tests that may be performed during your second trimester include:  Blood tests that check for:  Low iron levels (anemia).  Gestational diabetes (between 24 and 28 weeks).  Rh antibodies.  Urine tests to check for infections, diabetes, or protein in the urine.  An ultrasound to confirm the proper growth and development of the baby.  An amniocentesis to check for possible genetic problems.  Fetal screens for spina bifida and Down syndrome. HOME CARE INSTRUCTIONS   Avoid all smoking, herbs, alcohol, and unprescribed   drugs. These chemicals affect the formation and growth of the baby.  Follow your health care provider's instructions regarding medicine use. There are medicines that are either safe or unsafe to take during pregnancy.  Exercise only as directed by your health care provider. Experiencing uterine cramps is a good sign to stop exercising.  Continue to eat regular, healthy meals.  Wear a good support bra for breast tenderness.  Do not use hot tubs, steam rooms, or saunas.  Wear your  seat belt at all times when driving.  Avoid raw meat, uncooked cheese, cat litter boxes, and soil used by cats. These carry germs that can cause birth defects in the baby.  Take your prenatal vitamins.  Try taking a stool softener (if your health care provider approves) if you develop constipation. Eat more high-fiber foods, such as fresh vegetables or fruit and whole grains. Drink plenty of fluids to keep your urine clear or pale yellow.  Take warm sitz baths to soothe any pain or discomfort caused by hemorrhoids. Use hemorrhoid cream if your health care provider approves.  If you develop varicose veins, wear support hose. Elevate your feet for 15 minutes, 3-4 times a day. Limit salt in your diet.  Avoid heavy lifting, wear low heel shoes, and practice good posture.  Rest with your legs elevated if you have leg cramps or low back pain.  Visit your dentist if you have not gone yet during your pregnancy. Use a soft toothbrush to brush your teeth and be gentle when you floss.  A sexual relationship may be continued unless your health care provider directs you otherwise.  Continue to go to all your prenatal visits as directed by your health care provider. SEEK MEDICAL CARE IF:   You have dizziness.  You have mild pelvic cramps, pelvic pressure, or nagging pain in the abdominal area.  You have persistent nausea, vomiting, or diarrhea.  You have a bad smelling vaginal discharge.  You have pain with urination. SEEK IMMEDIATE MEDICAL CARE IF:   You have a fever.  You are leaking fluid from your vagina.  You have spotting or bleeding from your vagina.  You have severe abdominal cramping or pain.  You have rapid weight gain or loss.  You have shortness of breath with chest pain.  You notice sudden or extreme swelling of your face, hands, ankles, feet, or legs.  You have not felt your baby move in over an hour.  You have severe headaches that do not go away with  medicine.  You have vision changes. Document Released: 08/21/2001 Document Revised: 09/01/2013 Document Reviewed: 10/28/2012 Exit[employer] Patient Information 2015 [employer]. This information is not intended to replace advice given to you by your health care provider. Make sure you discuss any questions you have with your health care provider.  

## 2014-12-11 ENCOUNTER — Inpatient Hospital Stay (HOSPITAL_COMMUNITY)
Admission: AD | Admit: 2014-12-11 | Discharge: 2014-12-12 | Disposition: A | Discharge status: Home / Self Care | Payer: Managed Care, Other (non HMO) | Source: Ambulatory Visit | Attending: Obstetrics and Gynecology | Admitting: Obstetrics and Gynecology

## 2014-12-11 DIAGNOSIS — Z3A34 34 weeks gestation of pregnancy: Secondary | ICD-10-CM | POA: Insufficient documentation

## 2014-12-11 DIAGNOSIS — O9989 Other specified diseases and conditions complicating pregnancy, childbirth and the puerperium: Secondary | ICD-10-CM | POA: Insufficient documentation

## 2014-12-11 DIAGNOSIS — L293 Anogenital pruritus, unspecified: Secondary | ICD-10-CM | POA: Insufficient documentation

## 2014-12-11 DIAGNOSIS — N9089 Other specified noninflammatory disorders of vulva and perineum: Secondary | ICD-10-CM

## 2014-12-12 ENCOUNTER — Encounter (HOSPITAL_COMMUNITY): Payer: Self-pay | Admitting: *Deleted

## 2014-12-12 DIAGNOSIS — Z3A34 34 weeks gestation of pregnancy: Secondary | ICD-10-CM | POA: Diagnosis not present

## 2014-12-12 DIAGNOSIS — O9989 Other specified diseases and conditions complicating pregnancy, childbirth and the puerperium: Secondary | ICD-10-CM | POA: Diagnosis not present

## 2014-12-12 DIAGNOSIS — L293 Anogenital pruritus, unspecified: Secondary | ICD-10-CM | POA: Diagnosis present

## 2014-12-12 LAB — WET PREP, GENITAL
CLUE CELLS WET PREP: NONE SEEN
TRICH WET PREP: NONE SEEN
Yeast Wet Prep HPF POC: NONE SEEN

## 2014-12-12 NOTE — Progress Notes (Signed)
Venus Standard CNM on unit. Aware of pt's admission and status

## 2014-12-12 NOTE — MAU Provider Note (Signed)
MAU Addendum Note  Results for orders placed or performed during the hospital encounter of 12/11/14 (from the past 24 hour(s))  Wet prep, genital     Status: Abnormal   Collection Time: 12/12/14  1:15 AM  Result Value Ref Range   Yeast Wet Prep HPF POC NONE SEEN NONE SEEN   Trich, Wet Prep NONE SEEN NONE SEEN   Clue Cells Wet Prep HPF POC NONE SEEN NONE SEEN   WBC, Wet Prep HPF POC FEW (A) NONE SEEN     Plan: -Discussed need to follow up in office 12/14/13 -Bleeding and PTL Precautions -Encouraged to call if any questions or concerns arise prior to next scheduled office visit.  -Discharged to home in stable condition   Deral Schellenberg, CNM, MSN 12/12/2014. 2:04 AM

## 2014-12-12 NOTE — MAU Provider Note (Signed)
Tasha Chen is a 19 y.o. G1P0 at 34.4 weeks c/o vaginal itching and burning.  Denies vb, lof or ctx .  Report +HSV and that she received a cream at the office that is no longer  helping  History     Patient Active Problem List   Diagnosis Date Noted  . Vulvar irritation 09/14/2014  . BV (bacterial vaginosis) 06/11/2014  . Overweight (BMI 25.0-29.9) 06/11/2014    Chief Complaint  Patient presents with  . Vaginal Pain   HPI  OB History    Gravida Para Term Preterm AB TAB SAB Ectopic Multiple Living   1 0 0  0  0   0      Past Medical History  Diagnosis Date  . Medical history non-contributory     Past Surgical History  Procedure Laterality Date  . Umbilical hernia repair      Family History  Problem Relation Age of Onset  . Diabetes Father   . Diabetes Maternal Grandmother   . Diabetes Paternal Grandmother   . Cancer Paternal Grandfather     History  Substance Use Topics  . Smoking status: Never Smoker   . Smokeless tobacco: Not on file  . Alcohol Use: No    Allergies: No Known Allergies  Prescriptions prior to admission  Medication Sig Dispense Refill Last Dose  . Prenatal Vit-Fe Fumarate-FA (PRENATAL VITAMINS) 28-0.8 MG TABS Take 1 tablet by mouth daily. 100 tablet 1 Past Month at Unknown time  . nystatin-triamcinolone ointment (MYCOLOG) Apply 1 application topically 2 (two) times daily. 30 g 2 More than a month at Unknown time    ROS See HPI above, all other systems are negative  Physical Exam   Blood pressure 128/69, pulse 78, temperature 98.3 F (36.8 C), resp. rate 18, height 5\' 4"  (1.626 m), weight 188 lb 3.2 oz (85.367 kg), last menstrual period 04/13/2014, unknown if currently breastfeeding.  Physical Exam Ext:  WNL ABD: Soft, non tender to palpation, no rebound or guarding SVE: unremarkable for lesions, white creamy discharge noted    ED Course  Assessment: IUP at  31.4weeks Membranes: intact FHR: Category 1 CTX:   none  Plan: Labs: GC/CL, ConAgra FoodsWet prep,     Tasha Chen, CNM, MSN 12/12/2014. 1:18 AM

## 2014-12-12 NOTE — MAU Note (Signed)
2014 told i had Herpes. No outbreak until 3/19. By time i saw provider "cuts" were healed. Blood work negative. Retested couple wks ago with blood work and was negative. Given cream to put on outside. Yesterday started having pain and burning in vag area going back toward rectum and cream is not helping. Grey colored d/c

## 2014-12-12 NOTE — Progress Notes (Signed)
Ok to d/c efm per [employer] Standard CNM

## 2014-12-12 NOTE — Progress Notes (Signed)
Written and verbal d/c instructions given by Frazier ButtBenji Stanely CNM and understanding voiced.

## 2014-12-13 LAB — GC/CHLAMYDIA PROBE AMP (~~LOC~~) NOT AT ARMC
CHLAMYDIA, DNA PROBE: NEGATIVE
NEISSERIA GONORRHEA: NEGATIVE

## 2015-01-13 LAB — OB RESULTS CONSOLE GBS: STREP GROUP B AG: POSITIVE

## 2015-01-15 ENCOUNTER — Inpatient Hospital Stay (HOSPITAL_COMMUNITY)
Admission: AD | Admit: 2015-01-15 | Discharge: 2015-01-16 | Disposition: A | Discharge status: Home / Self Care | Payer: Managed Care, Other (non HMO) | Source: Ambulatory Visit | Attending: Obstetrics & Gynecology | Admitting: Obstetrics & Gynecology

## 2015-01-15 DIAGNOSIS — O9989 Other specified diseases and conditions complicating pregnancy, childbirth and the puerperium: Secondary | ICD-10-CM | POA: Insufficient documentation

## 2015-01-15 DIAGNOSIS — R102 Pelvic and perineal pain: Secondary | ICD-10-CM | POA: Insufficient documentation

## 2015-01-15 DIAGNOSIS — Z3A36 36 weeks gestation of pregnancy: Secondary | ICD-10-CM | POA: Diagnosis not present

## 2015-01-15 DIAGNOSIS — R109 Unspecified abdominal pain: Secondary | ICD-10-CM | POA: Insufficient documentation

## 2015-01-15 DIAGNOSIS — O26899 Other specified pregnancy related conditions, unspecified trimester: Secondary | ICD-10-CM

## 2015-01-15 NOTE — MAU Note (Signed)
Started having some pains in stomach that i think are contractions. Pelvic pressure. Took Tylenol and did not help. Denies LOF or bleeding. White d/c

## 2015-01-16 ENCOUNTER — Encounter (HOSPITAL_COMMUNITY): Payer: Self-pay

## 2015-01-16 DIAGNOSIS — O9989 Other specified diseases and conditions complicating pregnancy, childbirth and the puerperium: Secondary | ICD-10-CM | POA: Diagnosis not present

## 2015-01-16 LAB — URINALYSIS, ROUTINE W REFLEX MICROSCOPIC
Bilirubin Urine: NEGATIVE
GLUCOSE, UA: NEGATIVE mg/dL
HGB URINE DIPSTICK: NEGATIVE
Ketones, ur: NEGATIVE mg/dL
Leukocytes, UA: NEGATIVE
Nitrite: NEGATIVE
PH: 6.5 (ref 5.0–8.0)
PROTEIN: NEGATIVE mg/dL
Specific Gravity, Urine: 1.01 (ref 1.005–1.030)
UROBILINOGEN UA: 0.2 mg/dL (ref 0.0–1.0)

## 2015-01-16 LAB — WET PREP, GENITAL
CLUE CELLS WET PREP: NONE SEEN
Trich, Wet Prep: NONE SEEN
YEAST WET PREP: NONE SEEN

## 2015-01-16 MED ORDER — OXYCODONE-ACETAMINOPHEN 2.5-325 MG PO TABS: 1.0000 | ORAL_TABLET | Freq: Four times a day (QID) | ORAL | Status: DC | PRN
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
## 2015-01-16 NOTE — Discharge Instructions (Signed)
Abdominal Pain During Pregnancy Abdominal pain is common in pregnancy. Most of the time, it does not cause harm. There are many causes of abdominal pain. Some causes are more serious than others. Some of the causes of abdominal pain in pregnancy are easily diagnosed. Occasionally, the diagnosis takes time to understand. Other times, the cause is not determined. Abdominal pain can be a sign that something is very wrong with the pregnancy, or the pain may have nothing to do with the pregnancy at all. For this reason, always tell your health care provider if you have any abdominal discomfort. HOME CARE INSTRUCTIONS  Monitor your abdominal pain for any changes. The following actions may help to alleviate any discomfort you are experiencing:  Do not have sexual intercourse or put anything in your vagina until your symptoms go away completely.  Get plenty of rest until your pain improves.  Drink clear fluids if you feel nauseous. Avoid solid food as long as you are uncomfortable or nauseous.  Only take over-the-counter or prescription medicine as directed by your health care provider.  Keep all follow-up appointments with your health care provider. SEEK IMMEDIATE MEDICAL CARE IF:  You are bleeding, leaking fluid, or passing tissue from the vagina.  You have increasing pain or cramping.  You have persistent vomiting.  You have painful or bloody urination.  You have a fever.  You notice a decrease in your baby's movements.  You have extreme weakness or feel faint.  You have shortness of breath, with or without abdominal pain.  You develop a severe headache with abdominal pain.  You have abnormal vaginal discharge with abdominal pain.  You have persistent diarrhea.  You have abdominal pain that continues even after rest, or gets worse. MAKE SURE YOU:   Understand these instructions.  Will watch your condition.  Will get help right away if you are not doing well or get  worse. Document Released: 08/27/2005 Document Revised: 06/17/2013 Document Reviewed: 03/26/2013 [facility] Patient Information 2015 [employer], [place]. This information is not intended to replace advice given to you by your health care provider. Make sure you discuss any questions you have with your health care provider. Third Trimester of Pregnancy The third trimester is from week 29 through week 42, months 7 through 9. The third trimester is a time when the fetus is growing rapidly. At the end of the ninth month, the fetus is about 20 inches in length and weighs 6-10 pounds.  BODY CHANGES Your body goes through many changes during pregnancy. The changes vary from woman to woman.   Your weight will continue to increase. You can expect to gain 25-35 pounds (11-16 kg) by the end of the pregnancy.  You may begin to get stretch marks on your hips, abdomen, and breasts.  You may urinate more often because the fetus is moving lower into your pelvis and pressing on your bladder.  You may develop or continue to have heartburn as a result of your pregnancy.  You may develop constipation because certain hormones are causing the muscles that push waste through your intestines to slow down.  You may develop hemorrhoids or swollen, bulging veins (varicose veins).  You may have pelvic pain because of the weight gain and pregnancy hormones relaxing your joints between the bones in your pelvis. Backaches may result from overexertion of the muscles supporting your posture.  You may have changes in your hair. These can include thickening of your hair, rapid growth, and changes in texture. Some women  also have hair loss during or after pregnancy, or hair that feels dry or thin. Your hair will most likely return to normal after your baby is born.  Your breasts will continue to grow and be tender. A yellow discharge may leak from your breasts called colostrum.  Your belly button may stick out.  You may feel short  of breath because of your expanding uterus.  You may notice the fetus "dropping," or moving lower in your abdomen.  You may have a bloody mucus discharge. This usually occurs a few days to a week before labor begins.  Your cervix becomes thin and soft (effaced) near your due date. WHAT TO EXPECT AT YOUR PRENATAL EXAMS  You will have prenatal exams every 2 weeks until week 36. Then, you will have weekly prenatal exams. During a routine prenatal visit:  You will be weighed to make sure you and the fetus are growing normally.  Your blood pressure is taken.  Your abdomen will be measured to track your baby's growth.  The fetal heartbeat will be listened to.  Any test results from the previous visit will be discussed.  You may have a cervical check near your due date to see if you have effaced. At around 36 weeks, your caregiver will check your cervix. At the same time, your caregiver will also perform a test on the secretions of the vaginal tissue. This test is to determine if a type of bacteria, Group B streptococcus, is present. Your caregiver will explain this further. Your caregiver may ask you:  What your birth plan is.  How you are feeling.  If you are feeling the baby move.  If you have had any abnormal symptoms, such as leaking fluid, bleeding, severe headaches, or abdominal cramping.  If you have any questions. Other tests or screenings that may be performed during your third trimester include:  Blood tests that check for low iron levels (anemia).  Fetal testing to check the health, activity level, and growth of the fetus. Testing is done if you have certain medical conditions or if there are problems during the pregnancy. FALSE LABOR You may feel small, irregular contractions that eventually go away. These are called Braxton Hicks contractions, or false labor. Contractions may last for hours, days, or even weeks before true labor sets in. If contractions come at regular  intervals, intensify, or become painful, it is best to be seen by your caregiver.  SIGNS OF LABOR   Menstrual-like cramps.  Contractions that are 5 minutes apart or less.  Contractions that start on the top of the uterus and spread down to the lower abdomen and back.  A sense of increased pelvic pressure or back pain.  A watery or bloody mucus discharge that comes from the vagina. If you have any of these signs before the 37th week of pregnancy, call your caregiver right away. You need to go to the hospital to get checked immediately. HOME CARE INSTRUCTIONS   Avoid all smoking, herbs, alcohol, and unprescribed drugs. These chemicals affect the formation and growth of the baby.  Follow your caregiver's instructions regarding medicine use. There are medicines that are either safe or unsafe to take during pregnancy.  Exercise only as directed by your caregiver. Experiencing uterine cramps is a good sign to stop exercising.  Continue to eat regular, healthy meals.  Wear a good support bra for breast tenderness.  Do not use hot tubs, steam rooms, or saunas.  Wear your seat belt at all  times when driving.  Avoid raw meat, uncooked cheese, cat litter boxes, and soil used by cats. These carry germs that can cause birth defects in the baby.  Take your prenatal vitamins.  Try taking a stool softener (if your caregiver approves) if you develop constipation. Eat more high-fiber foods, such as fresh vegetables or fruit and whole grains. Drink plenty of fluids to keep your urine clear or pale yellow.  Take warm sitz baths to soothe any pain or discomfort caused by hemorrhoids. Use hemorrhoid cream if your caregiver approves.  If you develop varicose veins, wear support hose. Elevate your feet for 15 minutes, 3-4 times a day. Limit salt in your diet.  Avoid heavy lifting, wear low heal shoes, and practice good posture.  Rest a lot with your legs elevated if you have leg cramps or low back  pain.  Visit your dentist if you have not gone during your pregnancy. Use a soft toothbrush to brush your teeth and be gentle when you floss.  A sexual relationship may be continued unless your caregiver directs you otherwise.  Do not travel far distances unless it is absolutely necessary and only with the approval of your caregiver.  Take prenatal classes to understand, practice, and ask questions about the labor and delivery.  Make a trial run to the hospital.  Pack your hospital bag.  Prepare the baby's nursery.  Continue to go to all your prenatal visits as directed by your caregiver. SEEK MEDICAL CARE IF:  You are unsure if you are in labor or if your water has broken.  You have dizziness.  You have mild pelvic cramps, pelvic pressure, or nagging pain in your abdominal area.  You have persistent nausea, vomiting, or diarrhea.  You have a bad smelling vaginal discharge.  You have pain with urination. SEEK IMMEDIATE MEDICAL CARE IF:   You have a fever.  You are leaking fluid from your vagina.  You have spotting or bleeding from your vagina.  You have severe abdominal cramping or pain.  You have rapid weight loss or gain.  You have shortness of breath with chest pain.  You notice sudden or extreme swelling of your face, hands, ankles, feet, or legs.  You have not felt your baby move in over an hour.  You have severe headaches that do not go away with medicine.  You have vision changes. Document Released: 08/21/2001 Document Revised: 09/01/2013 Document Reviewed: 10/28/2012 [facility] LakesExitCare Patient Information 2015 [employer] LakeExitCare, MarylandLLC. This information is not intended to replace advice given to you by your health care provider. Make sure you discuss any questions you have with your health care provider.

## 2015-02-07 ENCOUNTER — Encounter (HOSPITAL_COMMUNITY): Payer: Self-pay | Admitting: *Deleted

## 2015-02-07 ENCOUNTER — Inpatient Hospital Stay (HOSPITAL_COMMUNITY)
Admission: AD | Admit: 2015-02-07 | Discharge: 2015-02-07 | Disposition: A | Discharge status: Home / Self Care | Payer: Managed Care, Other (non HMO) | Source: Ambulatory Visit | Attending: Obstetrics & Gynecology | Admitting: Obstetrics & Gynecology

## 2015-02-07 DIAGNOSIS — Z8249 Family history of ischemic heart disease and other diseases of the circulatory system: Secondary | ICD-10-CM | POA: Diagnosis not present

## 2015-02-07 DIAGNOSIS — R1030 Lower abdominal pain, unspecified: Secondary | ICD-10-CM | POA: Diagnosis present

## 2015-02-07 DIAGNOSIS — Z3A39 39 weeks gestation of pregnancy: Secondary | ICD-10-CM | POA: Diagnosis not present

## 2015-02-07 DIAGNOSIS — O479 False labor, unspecified: Secondary | ICD-10-CM

## 2015-02-07 LAB — AMNISURE RUPTURE OF MEMBRANE (ROM) NOT AT ARMC: AMNISURE: NEGATIVE

## 2015-02-07 LAB — WET PREP, GENITAL
Clue Cells Wet Prep HPF POC: NONE SEEN
Trich, Wet Prep: NONE SEEN
Yeast Wet Prep HPF POC: NONE SEEN

## 2015-02-07 MED ORDER — OXYCODONE-ACETAMINOPHEN 5-325 MG PO TABS
1.0000 | ORAL_TABLET | Freq: Once | ORAL | Status: AC
  Administered 2015-02-07: 2 via ORAL
  Filled 2015-02-07: qty 2

## 2015-02-07 NOTE — MAU Provider Note (Signed)
History    Tasha Chen is a 19y.o. G1P0 at 39.5wks who presents, unannounced, for contractions-type pain.  Patient states she was at work and started having lower abdominal pain that radiated to her back.  Patient also reports some leakage of fluid that occurred yesterday, but none today.  Patient reports good fetal movement and denies VB.    Patient Active Problem List   Diagnosis Date Noted  . Vulvar irritation 09/14/2014  . BV (bacterial vaginosis) 06/11/2014  . Overweight (BMI 25.0-29.9) 06/11/2014    No chief complaint on file.  HPI  OB History    Gravida Para Term Preterm AB TAB SAB Ectopic Multiple Living   1 0 0  0  0   0      Past Medical History  Diagnosis Date  . Herpes     Past Surgical History  Procedure Laterality Date  . Umbilical hernia repair      Family History  Problem Relation Age of Onset  . Hypertension Father   . Diabetes Maternal Grandmother   . Diabetes Paternal Grandmother   . Cancer Paternal Grandfather     History  Substance Use Topics  . Smoking status: Never Smoker   . Smokeless tobacco: Never Used  . Alcohol Use: No    Allergies: No Known Allergies  Prescriptions prior to admission  Medication Sig Dispense Refill Last Dose  . acetaminophen (TYLENOL) 325 MG tablet Take 650 mg by mouth every 6 (six) hours as needed for mild pain or headache.    Past Month at Unknown time  . diphenhydramine-acetaminophen (TYLENOL PM) 25-500 MG TABS Take 2 tablets by mouth at bedtime as needed (for pain at night).   Past Week at Unknown time  . nystatin-triamcinolone ointment (MYCOLOG) Apply 1 application topically 2 (two) times daily. 30 g 2 Past Week at Unknown time  . oxycodone-acetaminophen (PERCOCET) 2.5-325 MG per tablet Take 1-2 tablets by mouth every 6 (six) hours as needed for pain. (Patient not taking: Reported on 02/07/2015) 30 tablet 0 Not Taking at Unknown time  . Prenatal Vit-Fe Fumarate-FA (PRENATAL VITAMINS) 28-0.8 MG TABS Take 1  tablet by mouth daily. (Patient not taking: Reported on 02/07/2015) 100 tablet 1 Not Taking at Unknown time    ROS  See HPI Above Physical Exam   Blood pressure 112/73, pulse 85, temperature 98 F (36.7 C), temperature source Oral, resp. rate 16, height  (1.626 m), weight 89.359 kg (197 lb), last menstrual period 04/13/2014, unknown if currently breastfeeding.  Results for orders placed or performed during the hospital encounter of 02/07/15 (from the past 24 hour(s))  Amnisure rupture of membrane (rom)not at [facility]     Status: None   Collection Time: 02/07/15  9:05 PM  Result Value Ref Range   Amnisure ROM NEGATIVE   Wet prep, genital     Status: Abnormal   Collection Time: 02/07/15  9:05 PM  Result Value Ref Range   Yeast Wet Prep HPF POC NONE SEEN NONE SEEN   Trich, Wet Prep NONE SEEN NONE SEEN   Clue Cells Wet Prep HPF POC NONE SEEN NONE SEEN   WBC, Wet Prep HPF POC FEW (A) NONE SEEN    Physical Exam SVE: 1-2/70/-3  FHR:150 bpm, Mod Var, -Decels, +Accels UC: Irregularly graphed, palpates mild ED Course  Assessment: IUP at 39.5wks Cat I FT Contractions  Plan: -Labs: Amnisure, Wet Prep -SVE as above -Patient offered pain medication and requests PO medications -Percocet given -Will reassess in 1.5-2 hours  or as appropriate  Follow Up (2230) -Labs: Negative -Patient reports good results with PO medications -Declines SVE as no contractions graphed or palpated -Keep appt as scheduled:  -Encouraged to call if any questions or concerns arise prior to next scheduled office visit.  -Labor and Bleeding Precautions -Discharged to home in improved condition  Shaka Zech LYNN CNM, MSN 02/07/2015 8:53 PM

## 2015-02-07 NOTE — Discharge Instructions (Signed)
Third Trimester of Pregnancy The third trimester is from week 29 through week 42, months 7 through 9. The third trimester is a time when the fetus is growing rapidly. At the end of the ninth month, the fetus is about 20 inches in length and weighs 6-10 pounds.  BODY CHANGES Your body goes through many changes during pregnancy. The changes vary from woman to woman.   Your weight will continue to increase. You can expect to gain 25-35 pounds (11-16 kg) by the end of the pregnancy.  You may begin to get stretch marks on your hips, abdomen, and breasts.  You may urinate more often because the fetus is moving lower into your pelvis and pressing on your bladder.  You may develop or continue to have heartburn as a result of your pregnancy.  You may develop constipation because certain hormones are causing the muscles that push waste through your intestines to slow down.  You may develop hemorrhoids or swollen, bulging veins (varicose veins).  You may have pelvic pain because of the weight gain and pregnancy hormones relaxing your joints between the bones in your pelvis. Backaches may result from overexertion of the muscles supporting your posture.  You may have changes in your hair. These can include thickening of your hair, rapid growth, and changes in texture. Some women also have hair loss during or after pregnancy, or hair that feels dry or thin. Your hair will most likely return to normal after your baby is born.  Your breasts will continue to grow and be tender. A yellow discharge may leak from your breasts called colostrum.  Your belly button may stick out.  You may feel short of breath because of your expanding uterus.  You may notice the fetus "dropping," or moving lower in your abdomen.  You may have a bloody mucus discharge. This usually occurs a few days to a week before labor begins.  Your cervix becomes thin and soft (effaced) near your due date. WHAT TO EXPECT AT YOUR PRENATAL  EXAMS  You will have prenatal exams every 2 weeks until week 36. Then, you will have weekly prenatal exams. During a routine prenatal visit:  You will be weighed to make sure you and the fetus are growing normally.  Your blood pressure is taken.  Your abdomen will be measured to track your baby's growth.  The fetal heartbeat will be listened to.  Any test results from the previous visit will be discussed.  You may have a cervical check near your due date to see if you have effaced. At around 36 weeks, your caregiver will check your cervix. At the same time, your caregiver will also perform a test on the secretions of the vaginal tissue. This test is to determine if a type of bacteria, Group B streptococcus, is present. Your caregiver will explain this further. Your caregiver may ask you:  What your birth plan is.  How you are feeling.  If you are feeling the baby move.  If you have had any abnormal symptoms, such as leaking fluid, bleeding, severe headaches, or abdominal cramping.  If you have any questions. Other tests or screenings that may be performed during your third trimester include:  Blood tests that check for low iron levels (anemia).  Fetal testing to check the health, activity level, and growth of the fetus. Testing is done if you have certain medical conditions or if there are problems during the pregnancy. FALSE LABOR You may feel small, irregular contractions that   eventually go away. These are called Braxton Hicks contractions, or false labor. Contractions may last for hours, days, or even weeks before true labor sets in. If contractions come at regular intervals, intensify, or become painful, it is best to be seen by your caregiver.  SIGNS OF LABOR   Menstrual-like cramps.  Contractions that are 5 minutes apart or less.  Contractions that start on the top of the uterus and spread down to the lower abdomen and back.  A sense of increased pelvic pressure or back  pain.  A watery or bloody mucus discharge that comes from the vagina. If you have any of these signs before the 37th week of pregnancy, call your caregiver right away. You need to go to the hospital to get checked immediately. HOME CARE INSTRUCTIONS   Avoid all smoking, herbs, alcohol, and unprescribed drugs. These chemicals affect the formation and growth of the baby.  Follow your caregiver's instructions regarding medicine use. There are medicines that are either safe or unsafe to take during pregnancy.  Exercise only as directed by your caregiver. Experiencing uterine cramps is a good sign to stop exercising.  Continue to eat regular, healthy meals.  Wear a good support bra for breast tenderness.  Do not use hot tubs, steam rooms, or saunas.  Wear your seat belt at all times when driving.  Avoid raw meat, uncooked cheese, cat litter boxes, and soil used by cats. These carry germs that can cause birth defects in the baby.  Take your prenatal vitamins.  Try taking a stool softener (if your caregiver approves) if you develop constipation. Eat more high-fiber foods, such as fresh vegetables or fruit and whole grains. Drink plenty of fluids to keep your urine clear or pale yellow.  Take warm sitz baths to soothe any pain or discomfort caused by hemorrhoids. Use hemorrhoid cream if your caregiver approves.  If you develop varicose veins, wear support hose. Elevate your feet for 15 minutes, 3-4 times a day. Limit salt in your diet.  Avoid heavy lifting, wear low heal shoes, and practice good posture.  Rest a lot with your legs elevated if you have leg cramps or low back pain.  Visit your dentist if you have not gone during your pregnancy. Use a soft toothbrush to brush your teeth and be gentle when you floss.  A sexual relationship may be continued unless your caregiver directs you otherwise.  Do not travel far distances unless it is absolutely necessary and only with the approval  of your caregiver.  Take prenatal classes to understand, practice, and ask questions about the labor and delivery.  Make a trial run to the hospital.  Pack your hospital bag.  Prepare the baby's nursery.  Continue to go to all your prenatal visits as directed by your caregiver. SEEK MEDICAL CARE IF:  You are unsure if you are in labor or if your water has broken.  You have dizziness.  You have mild pelvic cramps, pelvic pressure, or nagging pain in your abdominal area.  You have persistent nausea, vomiting, or diarrhea.  You have a bad smelling vaginal discharge.  You have pain with urination. SEEK IMMEDIATE MEDICAL CARE IF:   You have a fever.  You are leaking fluid from your vagina.  You have spotting or bleeding from your vagina.  You have severe abdominal cramping or pain.  You have rapid weight loss or gain.  You have shortness of breath with chest pain.  You notice sudden or extreme swelling   of your face, hands, ankles, feet, or legs.  You have not felt your baby move in over an hour.  You have severe headaches that do not go away with medicine.  You have vision changes. Document Released: 08/21/2001 Document Revised: 09/01/2013 Document Reviewed: 10/28/2012 [employer] Patient Information 2015 [employer]. This information is not intended to replace advice given to you by your health care provider. Make sure you discuss any questions you have with your health care provider.  

## 2015-02-10 ENCOUNTER — Telehealth (HOSPITAL_COMMUNITY): Payer: Self-pay | Admitting: *Deleted

## 2015-02-10 ENCOUNTER — Encounter (HOSPITAL_COMMUNITY): Payer: Self-pay | Admitting: *Deleted

## 2015-02-10 NOTE — Telephone Encounter (Signed)
Preadmission screen  

## 2015-02-12 ENCOUNTER — Encounter (HOSPITAL_COMMUNITY): Payer: Self-pay | Admitting: *Deleted

## 2015-02-12 ENCOUNTER — Inpatient Hospital Stay (HOSPITAL_COMMUNITY)
Admission: AD | Admit: 2015-02-12 | Discharge: 2015-02-12 | Disposition: A | Discharge status: Home / Self Care | Payer: Managed Care, Other (non HMO) | Source: Ambulatory Visit | Attending: Obstetrics & Gynecology | Admitting: Obstetrics & Gynecology

## 2015-02-12 DIAGNOSIS — Z3A40 40 weeks gestation of pregnancy: Secondary | ICD-10-CM | POA: Insufficient documentation

## 2015-02-12 DIAGNOSIS — O368130 Decreased fetal movements, third trimester, not applicable or unspecified: Secondary | ICD-10-CM | POA: Diagnosis present

## 2015-02-12 LAB — URINALYSIS, ROUTINE W REFLEX MICROSCOPIC
Bilirubin Urine: NEGATIVE
GLUCOSE, UA: NEGATIVE mg/dL
Hgb urine dipstick: NEGATIVE
KETONES UR: NEGATIVE mg/dL
Leukocytes, UA: NEGATIVE
NITRITE: NEGATIVE
Protein, ur: NEGATIVE mg/dL
Specific Gravity, Urine: 1.025 (ref 1.005–1.030)
UROBILINOGEN UA: 0.2 mg/dL (ref 0.0–1.0)
pH: 6 (ref 5.0–8.0)

## 2015-02-12 NOTE — MAU Provider Note (Signed)
History    Tasha Chen is a 19y.o. G1P0 at 40.3wks who presents, unannounced, for decreased fetal movement.  Patient states she last felt movement at 1530, but had not yet ate.  Patient states that after eating she still had not felt movement and her mother informed her of need to report to MAU.  Patient also reports some contractions, upon arrival, but states they "are nothing like the pain I felt Monday."  Patient denies VB and LOF, but reports "my mucous plug is coming out."  Patient on monitor with good fetal hr noted, but denies denies perception of fetal movement.  BPP on Friday was 8/8.  Patient Active Problem List   Diagnosis Date Noted  . Vulvar irritation 09/14/2014  . BV (bacterial vaginosis) 06/11/2014  . Overweight (BMI 25.0-29.9) 06/11/2014    No chief complaint on file.  HPI  OB History    Gravida Para Term Preterm AB TAB SAB Ectopic Multiple Living   1 0 0  0  0   0      Past Medical History  Diagnosis Date  . Herpes   . Hx of varicella     Past Surgical History  Procedure Laterality Date  . Umbilical hernia repair      Family History  Problem Relation Age of Onset  . Hypertension Father   . Diabetes Maternal Grandmother   . Diabetes Paternal Grandmother   . Cancer Paternal Grandfather     History  Substance Use Topics  . Smoking status: Never Smoker   . Smokeless tobacco: Never Used  . Alcohol Use: No    Allergies: No Known Allergies  Prescriptions prior to admission  Medication Sig Dispense Refill Last Dose  . acetaminophen (TYLENOL) 325 MG tablet Take 650 mg by mouth every 6 (six) hours as needed for mild pain or headache.    02/11/2015 at Unknown time  . diphenhydramine-acetaminophen (TYLENOL PM) 25-500 MG TABS Take 2 tablets by mouth at bedtime as needed (for pain at night).   02/11/2015 at Unknown time  . nystatin-triamcinolone ointment (MYCOLOG) Apply 1 application topically 2 (two) times daily. 30 g 2 Past Week at Unknown time    ROS   See HPI Above Physical Exam   Blood pressure 123/75, pulse 84, temperature 97.8 F (36.6 C), temperature source Axillary, resp. rate 18, last menstrual period 04/13/2014, unknown if currently breastfeeding.  Results for orders placed or performed during the hospital encounter of 02/12/15 (from the past 24 hour(s))  Urinalysis, Routine w reflex microscopic (not at [facility])     Status: None   Collection Time: 02/12/15  9:30 PM  Result Value Ref Range   Color, Urine YELLOW YELLOW   APPearance CLEAR CLEAR   Specific Gravity, Urine 1.025 1.005 - 1.030   pH 6.0 5.0 - 8.0   Glucose, UA NEGATIVE NEGATIVE mg/dL   Hgb urine dipstick NEGATIVE NEGATIVE   Bilirubin Urine NEGATIVE NEGATIVE   Ketones, ur NEGATIVE NEGATIVE mg/dL   Protein, ur NEGATIVE NEGATIVE mg/dL   Urobilinogen, UA 0.2 0.0 - 1.0 mg/dL   Nitrite NEGATIVE NEGATIVE   Leukocytes, UA NEGATIVE NEGATIVE    Physical Exam SVE: 1-2/70/-3  FHR:125 bpm, Mod Var, -Decels, +Accels UC: Q2-92min, palpates mild  ED Course  Assessment: IUP at 40.4wks Cat I FT Decreased FM Contractions  Plan: -Await reactive NST -SVE remains same as previous exams -Discharge to home after NST reactive -Labor Precautions -IOL scheduled for 02/16/2015 Encouraged to call if any questions or concerns arise prior  to next scheduled office visit.   Deaunte Dente LYNN CNM, MSN 02/12/2015 10:00 PM

## 2015-02-12 NOTE — MAU Note (Signed)
Mucus plug did come out per patient

## 2015-02-12 NOTE — Discharge Instructions (Signed)
Braxton Hicks Contractions °Contractions of the uterus can occur throughout pregnancy. Contractions are not always a sign that you are in labor.  °WHAT ARE BRAXTON HICKS CONTRACTIONS?  °Contractions that occur before labor are called Braxton Hicks contractions, or false labor. Toward the end of pregnancy (32-34 weeks), these contractions can develop more often and may become more forceful. This is not true labor because these contractions do not result in opening (dilatation) and thinning of the cervix. They are sometimes difficult to tell apart from true labor because these contractions can be forceful and people have different pain tolerances. You should not feel embarrassed if you go to the hospital with false labor. Sometimes, the only way to tell if you are in true labor is for your health care provider to look for changes in the cervix. °If there are no prenatal problems or other health problems associated with the pregnancy, it is completely safe to be sent home with false labor and await the onset of true labor. °HOW CAN YOU TELL THE DIFFERENCE BETWEEN TRUE AND FALSE LABOR? °False Labor °· The contractions of false labor are usually shorter and not as hard as those of true labor.   °· The contractions are usually irregular.   °· The contractions are often felt in the front of the lower abdomen and in the groin.   °· The contractions may go away when you walk around or change positions while lying down.   °· The contractions get weaker and are shorter lasting as time goes on.   °· The contractions do not usually become progressively stronger, regular, and closer together as with true labor.   °True Labor °· Contractions in true labor last 30-70 seconds, become very regular, usually become more intense, and increase in frequency.   °· The contractions do not go away with walking.   °· The discomfort is usually felt in the top of the uterus and spreads to the lower abdomen and low back.   °· True labor can be  determined by your health care provider with an exam. This will show that the cervix is dilating and getting thinner.   °WHAT TO REMEMBER °· Keep up with your usual exercises and follow other instructions given by your health care provider.   °· Take medicines as directed by your health care provider.   °· Keep your regular prenatal appointments.   °· Eat and drink lightly if you think you are going into labor.   °· If Braxton Hicks contractions are making you uncomfortable:   °¨ Change your position from lying down or resting to walking, or from walking to resting.   °¨ Sit and rest in a tub of warm water.   °¨ Drink 2-3 glasses of water. Dehydration may cause these contractions.   °¨ Do slow and deep breathing several times an hour.   °WHEN SHOULD I SEEK IMMEDIATE MEDICAL CARE? °Seek immediate medical care if: °· Your contractions become stronger, more regular, and closer together.   °· You have fluid leaking or gushing from your vagina.   °· You have a fever.   °· You pass blood-tinged mucus.   °· You have vaginal bleeding.   °· You have continuous abdominal pain.   °· You have low back pain that you never had before.   °· You feel your baby's head pushing down and causing pelvic pressure.   °· Your baby is not moving as much as it used to.   °Document Released: 08/27/2005 Document Revised: 09/01/2013 Document Reviewed: 06/08/2013 °ExitCare® Patient Information ©2015 [employer]. This information is not intended to replace advice given to you by your health care   provider. Make sure you discuss any questions you have with your health care provider. °Fetal Movement Counts °Patient Name: __________________________________________________ Patient Due Date: ____________________ °Performing a fetal movement count is highly recommended in high-risk pregnancies, but it is good for every pregnant woman to do. Your health care provider may ask you to start counting fetal movements at 28 weeks of the pregnancy. Fetal  movements often increase: °· After eating a full meal. °· After physical activity. °· After eating or drinking something sweet or cold. °· At rest. °Pay attention to when you feel the baby is most active. This will help you notice a pattern of your baby's sleep and wake cycles and what factors contribute to an increase in fetal movement. It is important to perform a fetal movement count at the same time each day when your baby is normally most active.  °HOW TO COUNT FETAL MOVEMENTS °· Find a quiet and comfortable area to sit or lie down on your left side. Lying on your left side provides the best blood and oxygen circulation to your baby. °· Write down the day and time on a sheet of paper or in a journal. °· Start counting kicks, flutters, swishes, rolls, or jabs in a 2-hour period. You should feel at least 10 movements within 2 hours. °· If you do not feel 10 movements in 2 hours, wait 2-3 hours and count again. Look for a change in the pattern or not enough counts in 2 hours. °SEEK MEDICAL CARE IF: °· You feel less than 10 counts in 2 hours, tried twice. °· There is no movement in over an hour. °· The pattern is changing or taking longer each day to reach 10 counts in 2 hours. °· You feel the baby is not moving as he or she usually does. °Date: ____________ Movements: ____________ Start time: ____________ Finish time: ____________  °Date: ____________ Movements: ____________ Start time: ____________ Finish time: ____________ °Date: ____________ Movements: ____________ Start time: ____________ Finish time: ____________ °Date: ____________ Movements: ____________ Start time: ____________ Finish time: ____________ °Date: ____________ Movements: ____________ Start time: ____________ Finish time: ____________ °Date: ____________ Movements: ____________ Start time: ____________ Finish time: ____________ °Date: ____________ Movements: ____________ Start time: ____________ Finish time: ____________ °Date: ____________  Movements: ____________ Start time: ____________ Finish time: ____________  °Date: ____________ Movements: ____________ Start time: ____________ Finish time: ____________ °Date: ____________ Movements: ____________ Start time: ____________ Finish time: ____________ °Date: ____________ Movements: ____________ Start time: ____________ Finish time: ____________ °Date: ____________ Movements: ____________ Start time: ____________ Finish time: ____________ °Date: ____________ Movements: ____________ Start time: ____________ Finish time: ____________ °Date: ____________ Movements: ____________ Start time: ____________ Finish time: ____________ °Date: ____________ Movements: ____________ Start time: ____________ Finish time: ____________  °Date: ____________ Movements: ____________ Start time: ____________ Finish time: ____________ °Date: ____________ Movements: ____________ Start time: ____________ Finish time: ____________ °Date: ____________ Movements: ____________ Start time: ____________ Finish time: ____________ °Date: ____________ Movements: ____________ Start time: ____________ Finish time: ____________ °Date: ____________ Movements: ____________ Start time: ____________ Finish time: ____________ °Date: ____________ Movements: ____________ Start time: ____________ Finish time: ____________ °Date: ____________ Movements: ____________ Start time: ____________ Finish time: ____________  °Date: ____________ Movements: ____________ Start time: ____________ Finish time: ____________ °Date: ____________ Movements: ____________ Start time: ____________ Finish time: ____________ °Date: ____________ Movements: ____________ Start time: ____________ Finish time: ____________ °Date: ____________ Movements: ____________ Start time: ____________ Finish time: ____________ °Date: ____________ Movements: ____________ Start time: ____________ Finish time: ____________ °Date: ____________ Movements: ____________ Start time:  ____________ Finish time: ____________ °Date: ____________ Movements: ____________   Start time: ____________ Finish time: ____________  °Date: ____________ Movements: ____________ Start time: ____________ Finish time: ____________ °Date: ____________ Movements: ____________ Start time: ____________ Finish time: ____________ °Date: ____________ Movements: ____________ Start time: ____________ Finish time: ____________ °Date: ____________ Movements: ____________ Start time: ____________ Finish time: ____________ °Date: ____________ Movements: ____________ Start time: ____________ Finish time: ____________ °Date: ____________ Movements: ____________ Start time: ____________ Finish time: ____________ °Date: ____________ Movements: ____________ Start time: ____________ Finish time: ____________  °Date: ____________ Movements: ____________ Start time: ____________ Finish time: ____________ °Date: ____________ Movements: ____________ Start time: ____________ Finish time: ____________ °Date: ____________ Movements: ____________ Start time: ____________ Finish time: ____________ °Date: ____________ Movements: ____________ Start time: ____________ Finish time: ____________ °Date: ____________ Movements: ____________ Start time: ____________ Finish time: ____________ °Date: ____________ Movements: ____________ Start time: ____________ Finish time: ____________ °Date: ____________ Movements: ____________ Start time: ____________ Finish time: ____________  °Date: ____________ Movements: ____________ Start time: ____________ Finish time: ____________ °Date: ____________ Movements: ____________ Start time: ____________ Finish time: ____________ °Date: ____________ Movements: ____________ Start time: ____________ Finish time: ____________ °Date: ____________ Movements: ____________ Start time: ____________ Finish time: ____________ °Date: ____________ Movements: ____________ Start time: ____________ Finish time: ____________ °Date:  ____________ Movements: ____________ Start time: ____________ Finish time: ____________ °Date: ____________ Movements: ____________ Start time: ____________ Finish time: ____________  °Date: ____________ Movements: ____________ Start time: ____________ Finish time: ____________ °Date: ____________ Movements: ____________ Start time: ____________ Finish time: ____________ °Date: ____________ Movements: ____________ Start time: ____________ Finish time: ____________ °Date: ____________ Movements: ____________ Start time: ____________ Finish time: ____________ °Date: ____________ Movements: ____________ Start time: ____________ Finish time: ____________ °Date: ____________ Movements: ____________ Start time: ____________ Finish time: ____________ °Document Released: 09/26/2006 Document Revised: 01/11/2014 Document Reviewed: 06/23/2012 °ExitCare® Patient Information ©2015 [employer]. This information is not intended to replace advice given to you by your health care provider. Make sure you discuss any questions you have with your health care provider. ° °

## 2015-02-12 NOTE — MAU Note (Signed)
Could not feel the baby move. Tried to drink and eat but still no movement at all.

## 2015-02-13 ENCOUNTER — Encounter (HOSPITAL_COMMUNITY): Payer: Self-pay | Admitting: Obstetrics and Gynecology

## 2015-02-13 ENCOUNTER — Inpatient Hospital Stay (HOSPITAL_COMMUNITY)
Admission: AD | Admit: 2015-02-13 | Discharge: 2015-02-13 | Disposition: A | Discharge status: Home / Self Care | Payer: Managed Care, Other (non HMO) | Source: Ambulatory Visit | Attending: Obstetrics and Gynecology | Admitting: Obstetrics and Gynecology

## 2015-02-13 DIAGNOSIS — O99820 Streptococcus B carrier state complicating pregnancy: Secondary | ICD-10-CM | POA: Insufficient documentation

## 2015-02-13 DIAGNOSIS — Z3A40 40 weeks gestation of pregnancy: Secondary | ICD-10-CM | POA: Diagnosis not present

## 2015-02-13 DIAGNOSIS — O480 Post-term pregnancy: Secondary | ICD-10-CM | POA: Insufficient documentation

## 2015-02-13 DIAGNOSIS — B951 Streptococcus, group B, as the cause of diseases classified elsewhere: Secondary | ICD-10-CM | POA: Diagnosis present

## 2015-02-13 DIAGNOSIS — A6000 Herpesviral infection of urogenital system, unspecified: Secondary | ICD-10-CM | POA: Insufficient documentation

## 2015-02-13 DIAGNOSIS — O9989 Other specified diseases and conditions complicating pregnancy, childbirth and the puerperium: Secondary | ICD-10-CM | POA: Diagnosis present

## 2015-02-13 DIAGNOSIS — O98313 Other infections with a predominantly sexual mode of transmission complicating pregnancy, third trimester: Secondary | ICD-10-CM | POA: Insufficient documentation

## 2015-02-13 LAB — AMNISURE RUPTURE OF MEMBRANE (ROM) NOT AT ARMC: Amnisure ROM: NEGATIVE

## 2015-02-13 LAB — POCT FERN TEST

## 2015-02-13 NOTE — MAU Provider Note (Signed)
  History   19 yo G1P0 at 6240 4/7 weeks presented after calling with ? Leaking today.  Denies HA, visual sx, or epigastric pain, also denies UCs, reports +FM.  Patient scheduled for induction 02/16/15 at 7:30pm for post-dates.  Patient Active Problem List   Diagnosis Date Noted  . Positive GBS test 02/13/2015  . 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 Vulvar irritation 09/14/2014  . BV (bacterial vaginosis) 06/11/2014  . Overweight (BMI 25.0-29.9) 06/11/2014    Chief Complaint  Patient presents with  . Rupture of Membranes   HPI:  See above  OB History    Gravida Para Term Preterm AB TAB SAB Ectopic Multiple Living   1 0 0  0  0   0      Past Medical History  Diagnosis Date  . Hx of varicella     Past Surgical History  Procedure Laterality Date  . Umbilical hernia repair      Family History  Problem Relation Age of Onset  . Hypertension Father   . Diabetes Maternal Grandmother   . Diabetes Paternal Grandmother   . Cancer Paternal Grandfather     History  Substance Use Topics  . Smoking status: Never Smoker   . Smokeless tobacco: Never Used  . Alcohol Use: No    Allergies: No Known Allergies  Prescriptions prior to admission  Medication Sig Dispense Refill Last Dose  . acetaminophen (TYLENOL) 325 MG tablet Take 650 mg by mouth every 6 (six) hours as needed for mild pain or headache.    Past Week at Unknown time  . diphenhydramine-acetaminophen (TYLENOL PM) 25-500 MG TABS Take 2 tablets by mouth at bedtime as needed (for sleep).    Past Week at Unknown time  . nystatin-triamcinolone ointment (MYCOLOG) Apply 1 application topically 2 (two) times daily. (Patient taking differently: Apply 1 application topically 2 (two) times daily as needed (for rash). ) 30 g 2 Past Week at Unknown time    ROS:  Leaking fluid, +FM Physical Exam   Blood pressure 132/81, pulse 79, temperature 98.7 F (37.1 C), temperature source Oral, resp. rate 16, last menstrual period 04/13/2014, not  currently breastfeeding.    Physical Exam  In NAD Chest clear Heart RRR without murmur Abd gravid, NT  Pelvic--posterior, 1 cm, 70%, vtx, -1 (no change from office exam) Ext WNL  FHR Category 1 UCs very occasional  Results for orders placed or performed during the hospital encounter of 02/13/15 (from the past 24 hour(s))  [facility]     Status: None   Collection Time: 02/13/15  3:25 PM  Result Value Ref Range   Amnisure ROM NEGATIVE      ED Course  Assessment: IUP at 40 4/7 weeks No evidence SROM Category 1 FHR No labor GBS positive  Plan: D/c home with labor precautions. Keep scheduled appt for induction on 02/16/15 at 7:30p. Call/return with any issues or concerns.   Nigel BridgemanLATHAM, Devery Murgia CNM, MSN 02/13/2015 4:25 PM

## 2015-02-13 NOTE — Discharge Instructions (Signed)
Braxton Hicks Contractions °Contractions of the uterus can occur throughout pregnancy. Contractions are not always a sign that you are in labor.  °WHAT ARE BRAXTON HICKS CONTRACTIONS?  °Contractions that occur before labor are called Braxton Hicks contractions, or false labor. Toward the end of pregnancy (32-34 weeks), these contractions can develop more often and may become more forceful. This is not true labor because these contractions do not result in opening (dilatation) and thinning of the cervix. They are sometimes difficult to tell apart from true labor because these contractions can be forceful and people have different pain tolerances. You should not feel embarrassed if you go to the hospital with false labor. Sometimes, the only way to tell if you are in true labor is for your health care provider to look for changes in the cervix. °If there are no prenatal problems or other health problems associated with the pregnancy, it is completely safe to be sent home with false labor and await the onset of true labor. °HOW CAN YOU TELL THE DIFFERENCE BETWEEN TRUE AND FALSE LABOR? °False Labor °· The contractions of false labor are usually shorter and not as hard as those of true labor.   °· The contractions are usually irregular.   °· The contractions are often felt in the front of the lower abdomen and in the groin.   °· The contractions may go away when you walk around or change positions while lying down.   °· The contractions get weaker and are shorter lasting as time goes on.   °· The contractions do not usually become progressively stronger, regular, and closer together as with true labor.   °True Labor °· Contractions in true labor last 30-70 seconds, become very regular, usually become more intense, and increase in frequency.   °· The contractions do not go away with walking.   °· The discomfort is usually felt in the top of the uterus and spreads to the lower abdomen and low back.   °· True labor can be  determined by your health care provider with an exam. This will show that the cervix is dilating and getting thinner.   °WHAT TO REMEMBER °· Keep up with your usual exercises and follow other instructions given by your health care provider.   °· Take medicines as directed by your health care provider.   °· Keep your regular prenatal appointments.   °· Eat and drink lightly if you think you are going into labor.   °· If Braxton Hicks contractions are making you uncomfortable:   °¨ Change your position from lying down or resting to walking, or from walking to resting.   °¨ Sit and rest in a tub of warm water.   °¨ Drink 2-3 glasses of water. Dehydration may cause these contractions.   °¨ Do slow and deep breathing several times an hour.   °WHEN SHOULD I SEEK IMMEDIATE MEDICAL CARE? °Seek immediate medical care if: °· Your contractions become stronger, more regular, and closer together.   °· You have fluid leaking or gushing from your vagina.   °· You have a fever.   °· You pass blood-tinged mucus.   °· You have vaginal bleeding.   °· You have continuous abdominal pain.   °· You have low back pain that you never had before.   °· You feel your baby's head pushing down and causing pelvic pressure.   °· Your baby is not moving as much as it used to.   °Document Released: 08/27/2005 Document Revised: 09/01/2013 Document Reviewed: 06/08/2013 °ExitCare® Patient Information ©2015 [employer]. This information is not intended to replace advice given to you by your health care   provider. Make sure you discuss any questions you have with your health care provider. ° °

## 2015-02-15 ENCOUNTER — Encounter (HOSPITAL_COMMUNITY): Payer: Self-pay

## 2015-02-15 ENCOUNTER — Inpatient Hospital Stay (HOSPITAL_COMMUNITY): Payer: Managed Care, Other (non HMO) | Admitting: Anesthesiology

## 2015-02-15 ENCOUNTER — Inpatient Hospital Stay (HOSPITAL_COMMUNITY)
Admission: AD | Admit: 2015-02-15 | Discharge: 2015-02-18 | DRG: 775 | Disposition: A | Discharge status: Home / Self Care | Payer: Managed Care, Other (non HMO) | Source: Ambulatory Visit | Attending: Obstetrics & Gynecology | Admitting: Obstetrics & Gynecology

## 2015-02-15 DIAGNOSIS — O99824 Streptococcus B carrier state complicating childbirth: Secondary | ICD-10-CM | POA: Diagnosis present

## 2015-02-15 DIAGNOSIS — O9962 Diseases of the digestive system complicating childbirth: Secondary | ICD-10-CM | POA: Diagnosis present

## 2015-02-15 DIAGNOSIS — O99214 Obesity complicating childbirth: Secondary | ICD-10-CM | POA: Diagnosis present

## 2015-02-15 DIAGNOSIS — E669 Obesity, unspecified: Secondary | ICD-10-CM | POA: Diagnosis present

## 2015-02-15 DIAGNOSIS — D649 Anemia, unspecified: Secondary | ICD-10-CM | POA: Diagnosis not present

## 2015-02-15 DIAGNOSIS — O9081 Anemia of the puerperium: Secondary | ICD-10-CM | POA: Diagnosis not present

## 2015-02-15 DIAGNOSIS — Z3A40 40 weeks gestation of pregnancy: Secondary | ICD-10-CM | POA: Diagnosis present

## 2015-02-15 DIAGNOSIS — K219 Gastro-esophageal reflux disease without esophagitis: Secondary | ICD-10-CM | POA: Diagnosis present

## 2015-02-15 DIAGNOSIS — Z6834 Body mass index (BMI) 34.0-34.9, adult: Secondary | ICD-10-CM

## 2015-02-15 DIAGNOSIS — O480 Post-term pregnancy: Principal | ICD-10-CM | POA: Diagnosis present

## 2015-02-15 DIAGNOSIS — Z833 Family history of diabetes mellitus: Secondary | ICD-10-CM | POA: Diagnosis not present

## 2015-02-15 DIAGNOSIS — Z9889 Other specified postprocedural states: Secondary | ICD-10-CM

## 2015-02-15 DIAGNOSIS — D582 Other hemoglobinopathies: Secondary | ICD-10-CM | POA: Diagnosis present

## 2015-02-15 DIAGNOSIS — Z8249 Family history of ischemic heart disease and other diseases of the circulatory system: Secondary | ICD-10-CM

## 2015-02-15 DIAGNOSIS — Z8719 Personal history of other diseases of the digestive system: Secondary | ICD-10-CM

## 2015-02-15 LAB — CBC
HEMATOCRIT: 37.4 % (ref 36.0–46.0)
Hemoglobin: 13.2 g/dL (ref 12.0–15.0)
MCH: 27.4 pg (ref 26.0–34.0)
MCHC: 35.3 g/dL (ref 30.0–36.0)
MCV: 77.6 fL — ABNORMAL LOW (ref 78.0–100.0)
Platelets: 293 10*3/uL (ref 150–400)
RBC: 4.82 MIL/uL (ref 3.87–5.11)
RDW: 14.8 % (ref 11.5–15.5)
WBC: 11.9 10*3/uL — AB (ref 4.0–10.5)

## 2015-02-15 LAB — TYPE AND SCREEN
ABO/RH(D): O POS
Antibody Screen: NEGATIVE

## 2015-02-15 MED ORDER — PENICILLIN G POTASSIUM 5000000 UNITS IJ SOLR
5.0000 10*6.[IU] | Freq: Once | INTRAVENOUS | Status: AC
  Administered 2015-02-15: 5 10*6.[IU] via INTRAVENOUS
  Filled 2015-02-15: qty 5

## 2015-02-15 MED ORDER — PENICILLIN G POTASSIUM 5000000 UNITS IJ SOLR
2.5000 10*6.[IU] | INTRAVENOUS | Status: DC
  Administered 2015-02-15 – 2015-02-16 (×3): 2.5 10*6.[IU] via INTRAVENOUS
  Filled 2015-02-15 (×6): qty 2.5

## 2015-02-15 MED ORDER — OXYTOCIN 40 UNITS IN LACTATED RINGERS INFUSION - SIMPLE MED: 62.5000 mL/h | INTRAVENOUS | Status: DC

## 2015-02-15 MED ORDER — EPHEDRINE 5 MG/ML INJ
10.0000 mg | INTRAVENOUS | Status: DC | PRN
  Filled 2015-02-15: qty 4
  Filled 2015-02-15: qty 2

## 2015-02-15 MED ORDER — LACTATED RINGERS IV SOLN
500.0000 mL | INTRAVENOUS | Status: DC | PRN
  Administered 2015-02-16: 500 mL via INTRAVENOUS

## 2015-02-15 MED ORDER — OXYCODONE-ACETAMINOPHEN 5-325 MG PO TABS: 2.0000 | ORAL_TABLET | ORAL | Status: DC | PRN

## 2015-02-15 MED ORDER — LACTATED RINGERS IV SOLN
INTRAVENOUS | Status: DC
  Administered 2015-02-15 – 2015-02-16 (×4): via INTRAVENOUS

## 2015-02-15 MED ORDER — ZOLPIDEM TARTRATE 5 MG PO TABS: 5.0000 mg | ORAL_TABLET | Freq: Every evening | ORAL | Status: DC | PRN

## 2015-02-15 MED ORDER — OXYCODONE-ACETAMINOPHEN 5-325 MG PO TABS: 1.0000 | ORAL_TABLET | ORAL | Status: DC | PRN

## 2015-02-15 MED ORDER — OXYTOCIN 40 UNITS IN LACTATED RINGERS INFUSION - SIMPLE MED
1.0000 m[IU]/min | INTRAVENOUS | Status: DC
  Administered 2015-02-15: 2 m[IU]/min via INTRAVENOUS
  Filled 2015-02-15: qty 1000

## 2015-02-15 MED ORDER — OXYTOCIN BOLUS FROM INFUSION: 500.0000 mL | INTRAVENOUS | Status: DC

## 2015-02-15 MED ORDER — LIDOCAINE HCL (PF) 1 % IJ SOLN
30.0000 mL | INTRAMUSCULAR | Status: DC | PRN
  Filled 2015-02-15: qty 30

## 2015-02-15 MED ORDER — ONDANSETRON HCL 4 MG/2ML IJ SOLN
4.0000 mg | Freq: Four times a day (QID) | INTRAMUSCULAR | Status: DC | PRN
Start: 2015-02-15 — End: 2015-02-16
  Administered 2015-02-15: 4 mg via INTRAVENOUS
  Filled 2015-02-15: qty 2

## 2015-02-15 MED ORDER — DIPHENHYDRAMINE HCL 50 MG/ML IJ SOLN: 12.5000 mg | INTRAMUSCULAR | Status: DC | PRN

## 2015-02-15 MED ORDER — FLEET ENEMA 7-19 GM/118ML RE ENEM: 1.0000 | ENEMA | RECTAL | Status: DC | PRN

## 2015-02-15 MED ORDER — TERBUTALINE SULFATE 1 MG/ML IJ SOLN: 0.2500 mg | Freq: Once | INTRAMUSCULAR | Status: AC | PRN

## 2015-02-15 MED ORDER — FENTANYL 2.5 MCG/ML BUPIVACAINE 1/10 % EPIDURAL INFUSION (WH - ANES)
14.0000 mL/h | INTRAMUSCULAR | Status: DC | PRN
  Administered 2015-02-15: 14 mL/h via EPIDURAL
  Filled 2015-02-15 (×2): qty 125

## 2015-02-15 MED ORDER — FENTANYL 2.5 MCG/ML BUPIVACAINE 1/10 % EPIDURAL INFUSION (WH - ANES)
12.0000 mL/h | INTRAMUSCULAR | Status: DC | PRN
  Administered 2015-02-15: 12 mL/h via EPIDURAL
  Filled 2015-02-15: qty 125

## 2015-02-15 MED ORDER — LIDOCAINE HCL (PF) 1 % IJ SOLN
INTRAMUSCULAR | Status: DC | PRN
  Administered 2015-02-15 (×2): 3 mL

## 2015-02-15 MED ORDER — PHENYLEPHRINE 40 MCG/ML (10ML) SYRINGE FOR IV PUSH (FOR BLOOD PRESSURE SUPPORT)
80.0000 ug | PREFILLED_SYRINGE | INTRAVENOUS | Status: DC | PRN
  Filled 2015-02-15: qty 20
  Filled 2015-02-15: qty 2
  Filled 2015-02-15: qty 20

## 2015-02-15 MED ORDER — BUTORPHANOL TARTRATE 1 MG/ML IJ SOLN
1.0000 mg | INTRAMUSCULAR | Status: DC | PRN
  Administered 2015-02-15 (×2): 1 mg via INTRAVENOUS
  Filled 2015-02-15 (×2): qty 1

## 2015-02-15 MED ORDER — CITRIC ACID-SODIUM CITRATE 334-500 MG/5ML PO SOLN: 30.0000 mL | ORAL | Status: DC | PRN

## 2015-02-15 MED ORDER — PHENYLEPHRINE 40 MCG/ML (10ML) SYRINGE FOR IV PUSH (FOR BLOOD PRESSURE SUPPORT): 80.0000 ug | PREFILLED_SYRINGE | INTRAVENOUS | Status: DC | PRN

## 2015-02-15 MED ORDER — ACETAMINOPHEN 325 MG PO TABS: 650.0000 mg | ORAL_TABLET | ORAL | Status: DC | PRN

## 2015-02-15 NOTE — H&P (Signed)
Tasha FaviaChelsea Chen is a 19 y.o. female, G1P0 at 1040 6/7 weeks, presenting with increased discomfort with contractions over last 24 hours.  Has been seen several times over the past week for prodromal labor and ? Leaking, with all evaluations normal.  Scheduled for induction tomorrow for post-dates.  Denies leaking or bleeding, reports +FM.  Cervix was 1-2 on last exam in MAU 02/15/15.  Patient Active Problem List   Diagnosis Date Noted  . Normal labor 02/15/2015  . Post-dates pregnancy 02/15/2015  . 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 H/O umbilical hernia repair--age 48 02/15/2015  . Positive GBS test 02/13/2015  . Overweight (BMI 25.0-29.9) 06/11/2014    History of present pregnancy: Patient entered care at 9 6/7 weeks.   EDC of 02/09/15 was established by US at 6 5/7 weeks.   Anatomy scan:  18 weeks, with limited anatomy and a posterior placenta.   Additional US evaluations:  21 1/7 weeks:  Anatomy completed, normal fluid. Significant prenatal events:  General discomforts throughout pregnancy.  FOB not involved, patient's mother very supportive.  Seen in MAU at 18 6/7 weeks for vulvar irritation--negative HSV titers and cultures.  Had several MAU visits during pregnancy for various complaints.  Reported hx of HSV in 2014, but has had several negative titers during this pregnancy.  GBS positive. Last evaluation:  02/13/15 in MAU--cervix 1 cm, 70%, vtx, -1, reactive FHR.  OB History    Gravida Para Term Preterm AB TAB SAB Ectopic Multiple Living   1 0 0  0  0   0     Past Medical History  Diagnosis Date  . Hx of varicella    Past Surgical History  Procedure Laterality Date  . Umbilical hernia repair     Family History: family history includes Cancer in her paternal grandfather; Diabetes in her maternal grandmother and paternal grandmother; Hypertension in her father. Hypertension in mother.  Social History:  reports that she has never smoked. She has never used smokeless  tobacco. She reports that she does not drink alcohol or use illicit drugs. She is Tree surgeonAfrican American, high-school educated, employed as CSR.  FOB is not currently involved.  Patient is accompanied by her mother, who is very supportive.   Prenatal Transfer Tool  Maternal Diabetes: No Genetic Screening: Normal 1st trimester screen and AFP Maternal Ultrasounds/Referrals: Normal Fetal Ultrasounds or other Referrals:  None Maternal Substance Abuse:  No Significant Maternal Medications:  None Significant Maternal Lab Results: Lab values include: Group B Strep positive  TDAP 12/30/14 Flu 08/10/14  ROS:  Contractions, +FM, vaginal d/c.  No Known Allergies   Dilation: 2 Effacement (%): 90 Station: -2 Exam by:: Tasha Chen Blood pressure 116/76, pulse 93, temperature 99.1 F (37.3 C), temperature source Oral, resp. rate 18, height 5\' 4"  (1.626 m), weight 89.812 kg (198 lb), last menstrual period 04/13/2014.   In moderate distress with UCs Chest clear Heart RRR without murmur Abd gravid, NT, FH 40 cm Pelvic: As above, BBOW--difficulty tolerating pelvic exam Ext: WNL  FHR: Category  UCs:  q 2-6 min, mild/moderate  Prenatal labs: ABO, Rh: O/Positive/-- (10/12 0000) Antibody: Negative (10/12 0000) Rubella:   Immune RPR: Nonreactive (10/12 0000)  HBsAg: Negative (10/12 0000)  HIV: Non-reactive (10/12 0000)  GBS: Positive (05/05 0000) Sickle cell/Hgb electrophoresis:  Hgb C Pap:  NA GC:  Negative 06/11/14 and 01/13/15 Chlamydia:  Negative 06/11/14 and 01/13/15 Genetic screenings:  1st trimester screen and AFP WNL Glucola:  WNL Other:  Hgb 12.4 at NOB, 12.1 at 28 weeks HSV 1 & 2 titers negative 11/18/14, 12/01/14, 12/30/14 HSV culture 12/01/14 and 12/30/14 Urine culture 35K enterococcus 06/21/14 Negative urine culture 08/10/14 Urine culture 30K Ecoli 12/30/14    Assessment/Plan: IUP at 40 6/7 weeks Latent labor GBS positive Hgb C trait Hx umbilical hernia repair (age  14) Scheduled for induction tomorrow   Plan: Admit to [facility] per consult with Tasha Chen Routine [facility] orders Pain med/epidural prn--patient likely will want soon. PCN G for GBS prophylaxis If no change in labor status, will plan AROM/pitocin. Patient agreeable with plan.  Tasha Chen, [place], [place] 02/15/2015, 3:10 PM

## 2015-02-15 NOTE — Progress Notes (Signed)
  Subjective: Just received 2nd dose of Stadol.  Family at bedside.  Objective: BP 128/78 mmHg  Pulse 66  Temp(Src) 99.1 F (37.3 C) (Oral)  Resp 18  Ht 5\' 4"  (1.626 m)  Wt 89.812 kg (198 lb)  BMI 33.97 kg/m2  LMP 04/13/2014      FHT: Category 1 UC:   irregular, every 5-7 minutes SVE: Deferred     Assessment:  Early labor GBS positive Post-dates  Plan: Advised patient I would recommend proceeding with epidural due to intolerance of early labor, with recommendation made for AROM and pitocin after epidural placed.  Nigel BridgemanLATHAM, Shadee Montoya CNM 02/15/2015, 5:22 PM

## 2015-02-15 NOTE — Progress Notes (Signed)
  Subjective: Assumed care of this 19 yo G1P0 @ 41.0 wks admitted last evening in latent labor. Was a scheduled IOL for tonight. Comfortable w/ epidural, but complaining that legs too numb. Also reports catheter uncomfortable. Pt's mother at bedside.  Objective: BP 109/61 mmHg  Pulse 74  Temp(Src) 98.3 F (36.8 C) (Oral)  Resp 18  Ht 5\' 4"  (1.626 m)  Wt 89.812 kg (198 lb)  BMI 33.97 kg/m2  SpO2 100%  LMP 04/13/2014     Today's Vitals   02/15/15 2201 02/15/15 2231 02/15/15 2331 02/16/15 0001  BP: 124/62 137/77 108/50 109/61  Pulse: 57 67 68 74  Temp:    98.3 F (36.8 C)  TempSrc:    Oral  Resp: 18 18 18 18   Height:      Weight:      SpO2:      PainSc: 0-No pain 0-No pain 0-No pain 0-No pain   FHT: BL 110-120 w/ moderate variability, +accels, variables to 100 w/ spontaneous recovery UC:   irregular, every 2-3 minutes SVE:   Dilation: 6 Effacement (%): 100 Station: -1 Exam by:: penley, rn  Pitocin at 6 mU/min AROM, clear fluid at 23:54 PM - unable to assess cervix due to discomfort -- will give pt a little time before recheck.  Assessment:  IOL due to late term pregnancy GBS positive Cat 2 tracing -- resolved w/ intrauterine resuscitative measures  Plan: Continue intrauterine resuscitative measures. Continue treatment for GBS. Consult prn. Expect progress and SVD.  Sherre ScarletWILLIAMS, Keonte Daubenspeck CNM 02/16/2015, 12:07 AM

## 2015-02-15 NOTE — Plan of Care (Signed)
Problem: Phase I Progression Outcomes Goal: Pain controlled with appropriate interventions Outcome: Progressing Pt educated of risks and benefits of epidurak

## 2015-02-15 NOTE — Progress Notes (Signed)
  Subjective: Declining epidural at present--"wants to wait".  Does want to proceed with pitocin augmentation.  Objective: BP 128/78 mmHg  Pulse 66  Temp(Src) 99.1 F (37.3 C) (Oral)  Resp 18  Ht 5\' 4"  (1.626 m)  Wt 89.812 kg (198 lb)  BMI 33.97 kg/m2  LMP 04/13/2014      FHT: Category 1--baseline 100-120 with accels, moderate variability. Baseline has been lower since 2nd dose of Stadol. UC:   irregular, every 4-7 minutes SVE:   Dilation: 3 Effacement (%): 80 Station: -1 Exam by:: Vickie Rain Friedt-CNM  Cervix very posterior, exam difficult for patient.  Assessment:  Early labor GBS positive Irregular UCs  Plan: Start pitocin per low dose protocol. Epidural as pt desires.  Nigel BridgemanLATHAM, Cash Duce CNM 02/15/2015, 5:46 PM

## 2015-02-15 NOTE — MAU Note (Signed)
Contracting every 5-7 min, started  At 0900. No bleeding or leaking, passing mucous.  Was 1+ cm on Sunday. No problems with preg

## 2015-02-15 NOTE — Anesthesia Preprocedure Evaluation (Signed)
Anesthesia Evaluation  Patient identified by MRN, date of birth, ID band Patient awake    Reviewed: Allergy & Precautions, Patient's Chart, lab work & pertinent test results  Airway Mallampati: III  TM Distance: >3 FB Neck ROM: Full    Dental no notable dental hx. (+) Teeth Intact   Pulmonary neg pulmonary ROS,  breath sounds clear to auscultation  Pulmonary exam normal       Cardiovascular negative cardio ROS Normal cardiovascular examRhythm:Regular Rate:Normal     Neuro/Psych negative neurological ROS  negative psych ROS   GI/Hepatic Neg liver ROS, GERD-  Medicated,  Endo/Other  Obesity  Renal/GU negative Renal ROS  negative genitourinary   Musculoskeletal negative musculoskeletal ROS (+)   Abdominal (+) + obese,   Peds  Hematology Hb C trait   Anesthesia Other Findings   Reproductive/Obstetrics (+) Pregnancy                             Anesthesia Physical Anesthesia Plan  ASA: II  Anesthesia Plan: Epidural   Post-op Pain Management:    Induction:   Airway Management Planned: Natural Airway  Additional Equipment:   Intra-op Plan:   Post-operative Plan:   Informed Consent: I have reviewed the patients History and Physical, chart, labs and discussed the procedure including the risks, benefits and alternatives for the proposed anesthesia with the patient or authorized representative who has indicated his/her understanding and acceptance.     Plan Discussed with: Anesthesiologist  Anesthesia Plan Comments:         Anesthesia Quick Evaluation

## 2015-02-15 NOTE — Anesthesia Procedure Notes (Addendum)
Epidural Patient location during procedure: OB Start time: 02/15/2015 8:20 PM  Staffing Anesthesiologist: Mal AmabileFOSTER, Rossetta Kama Performed by: anesthesiologist   Preanesthetic Checklist Completed: patient identified, site marked, surgical consent, pre-op evaluation, timeout performed, IV checked, risks and benefits discussed and monitors and equipment checked  Epidural Patient position: sitting Prep: site prepped and draped and DuraPrep Patient monitoring: continuous pulse ox and blood pressure Approach: midline Location: L3-L4 Injection technique: LOR air  Needle:  Needle type: Tuohy  Needle gauge: 17 G Needle length: 9 cm and 9 Needle insertion depth: 7 cm Catheter type: closed end flexible Catheter size: 19 Gauge Catheter at skin depth: 12 cm Test dose: negative and Other  Assessment Events: blood not aspirated, injection not painful, no injection resistance, negative IV test and no paresthesia  Additional Notes Patient identified. Risks and benefits discussed including failed block, incomplete  Pain control, post dural puncture headache, nerve damage, paralysis, blood pressure Changes, nausea, vomiting, reactions to medications-both toxic and allergic and post Partum back pain. All questions were answered. Patient expressed understanding and wished to proceed. Sterile technique was used throughout procedure. Epidural site was Dressed with sterile barrier dressing. No paresthesias, signs of intravascular injection Or signs of intrathecal spread were encountered.  Patient was more comfortable after the epidural was dosed. Please see RN's note for documentation of vital signs and FHR which are stable.

## 2015-02-16 ENCOUNTER — Inpatient Hospital Stay (HOSPITAL_COMMUNITY): Admission: RE | Admit: 2015-02-16 | Payer: Managed Care, Other (non HMO) | Source: Ambulatory Visit

## 2015-02-16 ENCOUNTER — Encounter (HOSPITAL_COMMUNITY): Payer: Self-pay | Admitting: *Deleted

## 2015-02-16 LAB — CBC
HCT: 31.2 % — ABNORMAL LOW (ref 36.0–46.0)
Hemoglobin: 11 g/dL — ABNORMAL LOW (ref 12.0–15.0)
MCH: 26.8 pg (ref 26.0–34.0)
MCHC: 35.3 g/dL (ref 30.0–36.0)
MCV: 76.1 fL — ABNORMAL LOW (ref 78.0–100.0)
Platelets: 231 10*3/uL (ref 150–400)
RBC: 4.1 MIL/uL (ref 3.87–5.11)
RDW: 14.5 % (ref 11.5–15.5)
WBC: 14 10*3/uL — ABNORMAL HIGH (ref 4.0–10.5)

## 2015-02-16 LAB — RPR: RPR: NONREACTIVE

## 2015-02-16 LAB — HIV ANTIBODY (ROUTINE TESTING W REFLEX): HIV Screen 4th Generation wRfx: NONREACTIVE

## 2015-02-16 MED ORDER — ACETAMINOPHEN 325 MG PO TABS: 650.0000 mg | ORAL_TABLET | ORAL | Status: DC | PRN

## 2015-02-16 MED ORDER — IBUPROFEN 600 MG PO TABS
600.0000 mg | ORAL_TABLET | Freq: Four times a day (QID) | ORAL | Status: DC
  Administered 2015-02-16 – 2015-02-18 (×10): 600 mg via ORAL
  Filled 2015-02-16 (×10): qty 1

## 2015-02-16 MED ORDER — ZOLPIDEM TARTRATE 5 MG PO TABS: 5.0000 mg | ORAL_TABLET | Freq: Every evening | ORAL | Status: DC | PRN

## 2015-02-16 MED ORDER — DIPHENHYDRAMINE HCL 25 MG PO CAPS: 25.0000 mg | ORAL_CAPSULE | Freq: Four times a day (QID) | ORAL | Status: DC | PRN

## 2015-02-16 MED ORDER — FERROUS SULFATE 325 (65 FE) MG PO TABS
325.0000 mg | ORAL_TABLET | Freq: Two times a day (BID) | ORAL | Status: DC
  Administered 2015-02-16 – 2015-02-18 (×5): 325 mg via ORAL
  Filled 2015-02-16 (×5): qty 1

## 2015-02-16 MED ORDER — SENNOSIDES-DOCUSATE SODIUM 8.6-50 MG PO TABS
2.0000 | ORAL_TABLET | ORAL | Status: DC
  Administered 2015-02-17 – 2015-02-18 (×2): 2 via ORAL
  Filled 2015-02-16 (×2): qty 2

## 2015-02-16 MED ORDER — SIMETHICONE 80 MG PO CHEW: 80.0000 mg | CHEWABLE_TABLET | ORAL | Status: DC | PRN

## 2015-02-16 MED ORDER — OXYCODONE-ACETAMINOPHEN 5-325 MG PO TABS
1.0000 | ORAL_TABLET | ORAL | Status: DC | PRN
  Administered 2015-02-16 – 2015-02-17 (×3): 1 via ORAL
  Filled 2015-02-16 (×4): qty 1

## 2015-02-16 MED ORDER — BENZOCAINE-MENTHOL 20-0.5 % EX AERO
1.0000 "application " | INHALATION_SPRAY | CUTANEOUS | Status: DC | PRN
  Filled 2015-02-16: qty 56

## 2015-02-16 MED ORDER — ONDANSETRON HCL 4 MG/2ML IJ SOLN: 4.0000 mg | INTRAMUSCULAR | Status: DC | PRN

## 2015-02-16 MED ORDER — DIBUCAINE 1 % RE OINT: 1.0000 "application " | TOPICAL_OINTMENT | RECTAL | Status: DC | PRN

## 2015-02-16 MED ORDER — LANOLIN HYDROUS EX OINT: TOPICAL_OINTMENT | CUTANEOUS | Status: DC | PRN

## 2015-02-16 MED ORDER — WITCH HAZEL-GLYCERIN EX PADS
1.0000 "application " | MEDICATED_PAD | CUTANEOUS | Status: DC | PRN
  Administered 2015-02-16: 1 via TOPICAL

## 2015-02-16 MED ORDER — PRENATAL MULTIVITAMIN CH
1.0000 | ORAL_TABLET | Freq: Every day | ORAL | Status: DC
  Administered 2015-02-16 – 2015-02-18 (×3): 1 via ORAL
  Filled 2015-02-16 (×4): qty 1

## 2015-02-16 MED ORDER — TETANUS-DIPHTH-ACELL PERTUSSIS 5-2.5-18.5 LF-MCG/0.5 IM SUSP: 0.5000 mL | Freq: Once | INTRAMUSCULAR | Status: DC

## 2015-02-16 MED ORDER — OXYCODONE-ACETAMINOPHEN 5-325 MG PO TABS: 2.0000 | ORAL_TABLET | ORAL | Status: DC | PRN

## 2015-02-16 MED ORDER — ONDANSETRON HCL 4 MG PO TABS
4.0000 mg | ORAL_TABLET | ORAL | Status: DC | PRN
  Administered 2015-02-16: 4 mg via ORAL

## 2015-02-16 NOTE — Lactation Note (Signed)
This note was copied from the chart of Tasha Valda FaviaChelsea Tankard. Lactation Consultation Note Initial visit at 19 hours of age.  Mom reports baby just finished a 30 minute feeding and did well with it.  Baby asleep in moms arms now.  [facility] resources given and discussed.  Encouraged to feed with early cues on demand.  Early newborn behavior discussed.  Hand expression demonstrated by lc with colostrum visible, mom continues to attempt hand expression.  Mom to call for assist as needed.    Patient Name: Tasha Chen [id number]'s Date: 02/16/2015 Reason for consult: Initial assessment   Maternal Data Has patient been taught Hand Expression?: Yes Does the patient have breastfeeding experience prior to this delivery?: No  Feeding Feeding Type: Breast Fed Length of feed: 30 min  LATCH Score/Interventions                Intervention(s): Breastfeeding basics reviewed     Lactation Tools Discussed/Used     Consult Status Consult Status: Follow-up Date: 02/17/15 Follow-up type: In-patient    Shoptaw, Arvella MerlesJana Lynn 02/16/2015, 10:31 PM

## 2015-02-16 NOTE — Anesthesia Postprocedure Evaluation (Signed)
  Anesthesia Post-op Note  Patient: Tasha FaviaChelsea Chen  Procedure(s) Performed: * No procedures listed *  Patient Location: Mother/Baby  Anesthesia Type:Epidural  Level of Consciousness: awake  Airway and Oxygen Therapy: Patient Spontanous Breathing  Post-op Pain: mild  Post-op Assessment: Patient's Cardiovascular Status Stable and Respiratory Function Stable              Post-op Vital Signs: stable  Last Vitals:  Filed Vitals:   02/16/15 0813  BP: 117/72  Pulse: 64  Temp: 36.3 C  Resp:     Complications: No apparent anesthesia complications

## 2015-02-17 NOTE — Lactation Note (Signed)
This note was copied from the chart of Tasha Kayly Scholes. Lactation Consultation Note Follow up visit at 43 hours of age.  Baby had not had a stool in >24 hours, but was changed by [facility] when I undressed baby for STS and latch.  MOM pulled left nipple skin off when removing clothing stuck/dried to nipple.  Mom reports minimal bleeding earlier and now has a whitish blister type nipple tip.  Encouraged mom to express breastmilk and rub into nipple and allow to air dry and then use comfort gels in between feedings.   Mom has easily expressed colostrum.  Assisted with football hold on left breast.  Oral assessment with gloved finger reveals tongue extension past lower gum line.  Baby uses gums to bite down when sucking.  Suck training aids in tongue covering lower gum line, but biting noted with upper gum ridge as well.  Encouraged mom to cut her very long fingernail to allow for suck training.  When baby cries tongue is bowl shaped and short frenulum in noted under tongue.  Discussed with mom how tongue is used to help hold breast for latching and feeding.   Baby has wide open mouth with flanged lips.  Mom reports intense pain that decreased greatly after a few sucks.  Baby has strong rhythmic sucking and audible gulping.   Observed for 13 minutes and baby continues.  Encouraged mom to alternate breasts for feedings.    Mom may need a NS to be independent with latching on left breast.  Mom to call for assist as needed.    Patient Name: Tasha Chen [id number]'V Date: 02/17/2015 Reason for consult: Follow-up assessment;Breast/nipple pain;Difficult latch   Maternal Data    Feeding Feeding Type: Breast Fed Length of feed: 15 min  LATCH Score/Interventions Latch: Grasps breast easily, tongue down, lips flanged, rhythmical sucking. Intervention(s): Adjust position;Assist with latch;Breast massage;Breast compression  Audible Swallowing: Spontaneous and intermittent  Type of Nipple: Everted at rest and  after stimulation  Comfort (Breast/Nipple): Engorged, cracked, bleeding, large blisters, severe discomfort Problem noted: Cracked, bleeding, blisters, bruises  Problem noted: Severe discomfort Interventions (Mild/moderate discomfort): Comfort gels  Hold (Positioning): Assistance needed to correctly position infant at breast and maintain latch. Intervention(s): Breastfeeding basics reviewed;Support Pillows;Position options;Skin to skin  LATCH Score: 7  Lactation Tools Discussed/Used     Consult Status Consult Status: Follow-up Date: 02/18/15 Follow-up type: In-patient    Dillon Mcreynolds, Arvella Merles 02/17/2015, 11:11 PM

## 2015-02-17 NOTE — Lactation Note (Signed)
This note was copied from the chart of Tasha Meryle Kahley. Lactation Consultation Note: Mom reports that baby last fed about 1 hour ago. Mom reports that baby is latching better today. Reports left nipple is sore- looks intact, Asking about pumping. Has WIC- manual pump given with instructions for use and cleaning. Mom used pump and a few drops of Colostrum obtained. No questions at present To call for assist prn  Patient Name: Tasha Chen [id number] Date: 02/17/2015 Reason for consult: Follow-up assessment   Maternal Data Formula Feeding for Exclusion: Yes Reason for exclusion: Mother's choice to formula and breast feed on admission Has patient been taught Hand Expression?: Yes Does the patient have breastfeeding experience prior to this delivery?: No  Feeding    LATCH Score/Interventions       Type of Nipple: Everted at rest and after stimulation  Comfort (Breast/Nipple): Filling, red/small blisters or bruises, mild/mod discomfort  Problem noted: Mild/Moderate discomfort Interventions (Mild/moderate discomfort): Hand expression        Lactation Tools Discussed/Used WIC Program: Yes Pump Review: Setup, frequency, and cleaning Initiated by:: DW Date initiated:: 02/17/15   Consult Status Consult Status: Follow-up Date: 02/18/15 Follow-up type: In-patient    Pamelia Hoit 02/17/2015, 1:32 PM

## 2015-02-17 NOTE — Progress Notes (Signed)
Post Partum Day 1   Subjective:   no complaints.  Tolerating regular diet, ambulating and voiding without difficulty.  Pain well controlled. Scant lochia. Breastfeeding.    Objective: Blood pressure 133/73, pulse 65, temperature 98.2 F (36.8 C), temperature source Oral, resp. rate 20, height 5\' 4"  (1.626 m), weight 89.812 kg (198 lb), last menstrual period 04/13/2014, SpO2 100 %, unknown if currently breastfeeding.  Physical Exam:  General: alert, cooperative and no distress Lochia: appropriate Uterine Fundus: firm Incision: None DVT Evaluation: No evidence of DVT seen on physical exam.   Recent Labs  02/15/15 1420 02/16/15 0720  HGB 13.2 11.0*  HCT 37.4 31.2*   CBC    Component Value Date/Time   WBC 14.0* 02/16/2015 0720   RBC 4.10 02/16/2015 0720   HGB 11.0* 02/16/2015 0720   HCT 31.2* 02/16/2015 0720   PLT 231 02/16/2015 0720   MCV 76.1* 02/16/2015 0720   MCH 26.8 02/16/2015 0720   MCHC 35.3 02/16/2015 0720   RDW 14.5 02/16/2015 0720   LYMPHSABS 3.3 02/04/2014 1555   MONOABS 0.6 02/04/2014 1555   EOSABS 0.1 02/04/2014 1555   BASOSABS 0.0 02/04/2014 1555    Assessment/Plan: Doing well postpartum, continue with current care.  Plan for discharge tomorrow   LOS: 2 days   [facility] 02/17/2015, 2:27 PM

## 2015-02-17 NOTE — Progress Notes (Signed)
During fundal assessment, patient expressed being unhappy that I had to assess her and was guarding her abdomen with her hands. I explained why we check her fundus, and discussed bleeding.  During the assessment, patient moaned, squeezed her legs together and tried to guard her abdomen. After hesitation, I was able to assess fundus, it was noted to be firm, midline and 1 below. Bleeding was small.

## 2015-02-18 ENCOUNTER — Ambulatory Visit: Payer: Self-pay

## 2015-02-18 MED ORDER — PRENATAL MULTIVITAMIN CH: 1.0000 | ORAL_TABLET | Freq: Every day | ORAL | Status: DC

## 2015-02-18 MED ORDER — OXYCODONE-ACETAMINOPHEN 5-325 MG PO TABS: 1.0000 | ORAL_TABLET | ORAL | Status: DC | PRN

## 2015-02-18 MED ORDER — IBUPROFEN 600 MG PO TABS: 600.0000 mg | ORAL_TABLET | Freq: Four times a day (QID) | ORAL | Status: DC

## 2015-02-18 MED ORDER — FERROUS SULFATE 325 (65 FE) MG PO TABS: 325.0000 mg | ORAL_TABLET | Freq: Two times a day (BID) | ORAL | Status: DC

## 2015-02-18 NOTE — Lactation Note (Signed)
This note was copied from the chart of Tasha Jesenia Dilly. Lactation Consultation Note  Patient Name: Tasha Chen [id number] Date: 02/18/2015 Reason for consult: Follow-up assessment  Per mom I think my milk is in and I'm engorged. LC assessed breast tissue with moms permission  And noted both breast were very full , but not engorged.  LC reviewed sore nipple and engorgement prevention and tx .  Nipples - no breakdown noted, LC suspects mom is experienced  Transient soreness due to her milk coming in . LC recommended  To continue using EBM to nipples liberally, and before  latching hand  express off fullness or pre- pump if needed. Use comfort gels only for 6 days  And if still sore call  LC office.  Mother informed of post-discharge support and given phone number to the lactation department,  including services for phone call assistance; out-patient appointments; and breastfeeding support  group. List of other breastfeeding resources in the community given in the handout. Encouraged  mother to call for problems or concerns related to breastfeeding.   Maternal Data    Feeding    LATCH Score/Interventions                Intervention(s): Breastfeeding basics reviewed     Lactation Tools Discussed/Used Tools: Comfort gels (already had given to mom x3 by [facility] RN RN staff and [facility] )   Consult Status Consult Status: Complete Date: 02/18/15    Tasha Chen 02/18/2015, 3:56 PM

## 2015-02-18 NOTE — Discharge Summary (Signed)
Vaginal Delivery Discharge Summary  ALL information will be verified prior to discharge  Tasha Chen  DOB:    May 01, 1996 MRN:    [id number] CSN:    [id number]  Date of admission:                  02/15/15  Date of discharge:                   02/18/15  Procedures this admission: SVD  Date of Delivery: 02/16/15  Newborn Data:  Live born  Information for the patient's newborn:  Kiandria, Braisted [[id number]]  female   Live born female  Birth Weight: 6 lb 14.2 oz (3125 g) APGAR: 8, 9  Home with mother. Name: Tasha Chen    History of Present Illness: Ms. Tasha Chen is a 19 y.o. female, G1P1001, who presents at [redacted]w[redacted]d weeks gestation. The patient has been followed at the [facility] and Gynecology division of [facility]. She was admitted onset of labor. Her pregnancy has been complicated by:  Patient Active Problem List   Diagnosis Date Noted  . Vaginal delivery 02/16/2015  . 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 H/O umbilical hernia repair--age 78 02/15/2015  . Positive GBS test 02/13/2015  . Overweight (BMI 25.0-29.9) 06/11/2014    Hospital course: The patient was admitted for labor.   Her labor was not complicated. She proceeded to have a vaginal delivery of a healthy infant. Her delivery was not complicated. Her postpartum course was not complicated. She was discharged to home on postpartum day 2 doing well.  Feeding: breast  Contraception: unsure  Discharge hemoglobin: HEMOGLOBIN  Date Value Ref Range Status  02/16/2015 11.0* 12.0 - 15.0 g/dL Final   HCT  Date Value Ref Range Status  02/16/2015 31.2* 36.0 - 46.0 % Final    PreNatal Labs ABO, Rh: --/--/O POS (06/07 1420)   Antibody: NEG (06/07 1420) Rubella:    immune RPR: Non Reactive (06/07 1420)  HBsAg: Negative (10/12 0000)  HIV: Non-reactive (10/12 0000)  GBS: Positive (05/05 0000)  Discharge Physical Exam:  General: alert and  cooperative Lochia: appropriate Uterine Fundus: firm Incision: healing well DVT Evaluation: No evidence of DVT seen on physical exam.  Intrapartum Procedures: spontaneous vaginal delivery Postpartum Procedures: none Complications-Operative and Postpartum: Bilateral Labial  Discharge Diagnoses: Post-date pregnancy,  asymptomatic anemia  Activity:           pelvic rest Diet:                routine Medications: PNV, Ibuprofen, Iron and Percocet Condition:      stable     Postpartum Teaching: Nutrition, exercise, return to work or school, family visits, sexual activity, home rest, vaginal bleeding, pelvic rest, family planning, s/s of PPD, breast care peri-care and incision care   Discharge to: home  Follow-up Information    Follow up with [facility]. Schedule an appointment as soon as possible for a visit in 6 weeks.   Specialty:  Obstetrics and Gynecology   Why:  Postpartum check up   Contact information:   [address] [place] [address] [phone]       Holston Oyama, CNM, MSN 02/18/2015. 8:01 AM  All information will be verified prior to discharge   Postpartum Care After Vaginal Delivery  After you deliver your newborn (postpartum period), the usual stay in the hospital is 24 72 hours. If there were problems with  your labor or delivery, or if you have other medical problems, you might be in the hospital longer.  While you are in the hospital, you will receive help and instructions on how to care for yourself and your newborn during the postpartum period.  While you are in the hospital:  Be sure to tell your nurses if you have pain or discomfort, as well as where you feel the pain and what makes the pain worse.  If you had an incision made near your vagina (episiotomy) or if you had some tearing during delivery, the nurses may put ice packs on your episiotomy or tear. The ice packs may help to reduce  the pain and swelling.  If you are breastfeeding, you may feel uncomfortable contractions of your uterus for a couple of weeks. This is normal. The contractions help your uterus get back to normal size.  It is normal to have some bleeding after delivery.  For the first 1 3 days after delivery, the flow is red and the amount may be similar to a period.  It is common for the flow to start and stop.  In the first few days, you may pass some small clots. Let your nurses know if you begin to pass large clots or your flow increases.  Do not  flush blood clots down the toilet before having the nurse look at them.  During the next 3 10 days after delivery, your flow should become more watery and pink or brown-tinged in color.  Ten to fourteen days after delivery, your flow should be a small amount of yellowish-white discharge.  The amount of your flow will decrease over the first few weeks after delivery. Your flow may stop in 6 8 weeks. Most women have had their flow stop by 12 weeks after delivery.  You should change your sanitary pads frequently.  Wash your hands thoroughly with soap and water for at least 20 seconds after changing pads, using the toilet, or before holding or feeding your newborn.  You should feel like you need to empty your bladder within the first 6 8 hours after delivery.  In case you become weak, lightheaded, or faint, call your nurse before you get out of bed for the first time and before you take a shower for the first time.  Within the first few days after delivery, your breasts may begin to feel tender and full. This is called engorgement. Breast tenderness usually goes away within 48 72 hours after engorgement occurs. You may also notice milk leaking from your breasts. If you are not breastfeeding, do not stimulate your breasts. Breast stimulation can make your breasts produce more milk.  Spending as much time as possible with your newborn is very important. During  this time, you and your newborn can feel close and get to know each other. Having your newborn stay in your room (rooming in) will help to strengthen the bond with your newborn. It will give you time to get to know your newborn and become comfortable caring for your newborn.  Your hormones change after delivery. Sometimes the hormone changes can temporarily cause you to feel sad or tearful. These feelings should not last more than a few days. If these feelings last longer than that, you should talk to your caregiver.  If desired, talk to your caregiver about methods of family planning or contraception.  Talk to your caregiver about immunizations. Your caregiver may want you to have the following immunizations before leaving the  hospital:  Tetanus, diphtheria, and pertussis (Tdap) or tetanus and diphtheria (Td) immunization. It is very important that you and your family (including grandparents) or others caring for your newborn are up-to-date with the Tdap or Td immunizations. The Tdap or Td immunization can help protect your newborn from getting ill.  Rubella immunization.  Varicella (chickenpox) immunization.  Influenza immunization. You should receive this annual immunization if you did not receive the immunization during your pregnancy. Document Released: 06/24/2007 Document Revised: 05/21/2012 Document Reviewed: 04/23/2012 [facility] Patient Information 2014 [place].   Postpartum Depression and Baby Blues  The postpartum period begins right after the birth of a baby. During this time, there is often a great amount of joy and excitement. It is also a time of considerable changes in the life of the parent(s). Regardless of how many times a mother gives birth, each child brings new challenges and dynamics to the family. It is not unusual to have feelings of excitement accompanied by confusing shifts in moods, emotions, and thoughts. All mothers are at risk of developing postpartum  depression or the "baby blues." These mood changes can occur right after giving birth, or they may occur many months after giving birth. The baby blues or postpartum depression can be mild or severe. Additionally, postpartum depression can resolve rather quickly, or it can be a long-term condition. CAUSES Elevated hormones and their rapid decline are thought to be a main cause of postpartum depression and the baby blues. There are a number of hormones that radically change during and after pregnancy. Estrogen and progesterone usually decrease immediately after delivering your baby. The level of thyroid hormone and various cortisol steroids also rapidly drop. Other factors that play a major role in these changes include major life events and genetics.  RISK FACTORS If you have any of the following risks for the baby blues or postpartum depression, know what symptoms to watch out for during the postpartum period. Risk factors that may increase the likelihood of getting the baby blues or postpartum depression include: 1. Havinga personal or family history of depression. 2. Having depression while being pregnant. 3. Having premenstrual or oral contraceptive-associated mood issues. 4. Having exceptional life stress. 5. Having marital conflict. 6. Lacking a social support network. 7. Having a baby with special needs. 8. Having health problems such as diabetes. SYMPTOMS Baby blues symptoms include:  Brief fluctuations in mood, such as going from extreme happiness to sadness.  Decreased concentration.  Difficulty sleeping.  Crying spells, tearfulness.  Irritability.  Anxiety. Postpartum depression symptoms typically begin within the first month after giving birth. These symptoms include:  Difficulty sleeping or excessive sleepiness.  Marked weight loss.  Agitation.  Feelings of worthlessness.  Lack of interest in activity or food. Postpartum psychosis is a very concerning condition and  can be dangerous. Fortunately, it is rare. Displaying any of the following symptoms is cause for immediate medical attention. Postpartum psychosis symptoms include:  Hallucinations and delusions.  Bizarre or disorganized behavior.  Confusion or disorientation. DIAGNOSIS  A diagnosis is made by an evaluation of your symptoms. There are no medical or lab tests that lead to a diagnosis, but there are various questionnaires that a caregiver may use to identify those with the baby blues, postpartum depression, or psychosis. Often times, a screening tool called the [place] Postnatal Depression Scale is used to diagnose depression in the postpartum period.  TREATMENT The baby blues usually goes away on its own in 1 to 2 weeks. Social support is  often all that is needed. You should be encouraged to get adequate sleep and rest. Occasionally, you may be given medicines to help you sleep.  Postpartum depression requires treatment as it can last several months or longer if it is not treated. Treatment may include individual or group therapy, medicine, or both to address any social, physiological, and psychological factors that may play a role in the depression. Regular exercise, a healthy diet, rest, and social support may also be strongly recommended.  Postpartum psychosis is more serious and needs treatment right away. Hospitalization is often needed. HOME CARE INSTRUCTIONS  Get as much rest as you can. Nap when the baby sleeps.  Exercise regularly. Some women find yoga and walking to be beneficial.  Eat a balanced and nourishing diet.  Do little things that you enjoy. Have a cup of tea, take a bubble bath, read your favorite magazine, or listen to your favorite music.  Avoid alcohol.  Ask for help with household chores, cooking, grocery shopping, or running errands as needed. Do not try to do everything.  Talk to people close to you about how you are feeling. Get support from your partner, family  members, friends, or other new moms.  Try to stay positive in how you think. Think about the things you are grateful for.  Do not spend a lot of time alone.  Only take medicine as directed by your caregiver.  Keep all your postpartum appointments.  Let your caregiver know if you have any concerns. SEEK MEDICAL CARE IF: You are having a reaction or problems with your medicine. SEEK IMMEDIATE MEDICAL CARE IF:  You have suicidal feelings.  You feel you may harm the baby or someone else. Document Released: 05/31/2004 Document Revised: 11/19/2011 Document Reviewed: 07/03/2011 [facility] Patient Information 2014 [place], [place].     Breastfeeding Deciding to breastfeed is one of the best choices you can make for you and your baby. A change in hormones during pregnancy causes your breast tissue to grow and increases the number and size of your milk ducts. These hormones also allow proteins, sugars, and fats from your blood supply to make breast milk in your milk-producing glands. Hormones prevent breast milk from being released before your baby is born as well as prompt milk flow after birth. Once breastfeeding has begun, thoughts of your baby, as well as his or her sucking or crying, can stimulate the release of milk from your milk-producing glands.  BENEFITS OF BREASTFEEDING For Your Baby  Your first milk (colostrum) helps your baby's digestive system function better.   There are antibodies in your milk that help your baby fight off infections.   Your baby has a lower incidence of asthma, allergies, and sudden infant death syndrome.   The nutrients in breast milk are better for your baby than infant formulas and are designed uniquely for your baby's needs.   Breast milk improves your baby's brain development.   Your baby is less likely to develop other conditions, such as childhood obesity, asthma, or type 2 diabetes mellitus.  For You   Breastfeeding helps to create a very  special bond between you and your baby.   Breastfeeding is convenient. Breast milk is always available at the correct temperature and costs nothing.   Breastfeeding helps to burn calories and helps you lose the weight gained during pregnancy.   Breastfeeding makes your uterus contract to its prepregnancy size faster and slows bleeding (lochia) after you give birth.   Breastfeeding helps to  lower your risk of developing type 2 diabetes mellitus, osteoporosis, and breast or ovarian cancer later in life. SIGNS THAT YOUR BABY IS HUNGRY Early Signs of Hunger  Increased alertness or activity.  Stretching.  Movement of the head from side to side.  Movement of the head and opening of the mouth when the corner of the mouth or cheek is stroked (rooting).  Increased sucking sounds, smacking lips, cooing, sighing, or squeaking.  Hand-to-mouth movements.  Increased sucking of fingers or hands. Late Signs of Hunger  Fussing.  Intermittent crying. Extreme Signs of Hunger Signs of extreme hunger will require calming and consoling before your baby will be able to breastfeed successfully. Do not wait for the following signs of extreme hunger to occur before you initiate breastfeeding:   Restlessness.  A loud, strong cry.   Screaming.   BREASTFEEDING BASICS Breastfeeding Initiation  Find a comfortable place to sit or lie down, with your neck and back well supported.  Place a pillow or rolled up blanket under your baby to bring him or her to the level of your breast (if you are seated). Nursing pillows are specially designed to help support your arms and your baby while you breastfeed.  Make sure that your baby's abdomen is facing your abdomen.   Gently massage your breast. With your fingertips, massage from your chest wall toward your nipple in a circular motion. This encourages milk flow. You may need to continue this action during the feeding if your milk flows  slowly.  Support your breast with 4 fingers underneath and your thumb above your nipple. Make sure your fingers are well away from your nipple and your baby's mouth.   Stroke your baby's lips gently with your finger or nipple.   When your baby's mouth is open wide enough, quickly bring your baby to your breast, placing your entire nipple and as much of the colored area around your nipple (areola) as possible into your baby's mouth.   More areola should be visible above your baby's upper lip than below the lower lip.   Your baby's tongue should be between his or her lower gum and your breast.   Ensure that your baby's mouth is correctly positioned around your nipple (latched). Your baby's lips should create a seal on your breast and be turned out (everted).  It is common for your baby to suck about 2-3 minutes in order to start the flow of breast milk. Latching Teaching your baby how to latch on to your breast properly is very important. An improper latch can cause nipple pain and decreased milk supply for you and poor weight gain in your baby. Also, if your baby is not latched onto your nipple properly, he or she may swallow some air during feeding. This can make your baby fussy. Burping your baby when you switch breasts during the feeding can help to get rid of the air. However, teaching your baby to latch on properly is still the best way to prevent fussiness from swallowing air while breastfeeding. Signs that your baby has successfully latched on to your nipple:    Silent tugging or silent sucking, without causing you pain.   Swallowing heard between every 3-4 sucks.    Muscle movement above and in front of his or her ears while sucking.  Signs that your baby has not successfully latched on to nipple:   Sucking sounds or smacking sounds from your baby while breastfeeding.  Nipple pain. If you think your baby  has not latched on correctly, slip your finger into the corner of  your baby's mouth to break the suction and place it between your baby's gums. Attempt breastfeeding initiation again. Signs of Successful Breastfeeding Signs from your baby:   A gradual decrease in the number of sucks or complete cessation of sucking.   Falling asleep.   Relaxation of his or her body.   Retention of a small amount of milk in his or her mouth.   Letting go of your breast by himself or herself. Signs from you:  Breasts that have increased in firmness, weight, and size 1-3 hours after feeding.   Breasts that are softer immediately after breastfeeding.  Increased milk volume, as well as a change in milk consistency and color by the fifth day of breastfeeding.   Nipples that are not sore, cracked, or bleeding. Signs That Your Randel Books is Getting Enough Milk  Wetting at least 3 diapers in a 24-hour period. The urine should be clear and pale yellow by age 37 days.  At least 3 stools in a 24-hour period by age 37 days. The stool should be soft and yellow.  At least 3 stools in a 24-hour period by age 22 days. The stool should be seedy and yellow.  No loss of weight greater than 10% of birth weight during the first 32 days of age.  Average weight gain of 4-7 ounces (113-198 g) per week after age 62 days.  Consistent daily weight gain by age 87 days, without weight loss after the age of 2 weeks. After a feeding, your baby may spit up a small amount. This is common. BREASTFEEDING FREQUENCY AND DURATION Frequent feeding will help you make more milk and can prevent sore nipples and breast engorgement. Breastfeed when you feel the need to reduce the fullness of your breasts or when your baby shows signs of hunger. This is called "breastfeeding on demand." Avoid introducing a pacifier to your baby while you are working to establish breastfeeding (the first 4-6 weeks after your baby is born). After this time you may choose to use a pacifier. Research has shown that pacifier use  during the first year of a baby's life decreases the risk of sudden infant death syndrome (SIDS). Allow your baby to feed on each breast as long as he or she wants. Breastfeed until your baby is finished feeding. When your baby unlatches or falls asleep while feeding from the first breast, offer the second breast. Because newborns are often sleepy in the first few weeks of life, you may need to awaken your baby to get him or her to feed. Breastfeeding times will vary from baby to baby. However, the following rules can serve as a guide to help you ensure that your baby is properly fed:  Newborns (babies 25 weeks of age or younger) may breastfeed every 1-3 hours.  Newborns should not go longer than 3 hours during the day or 5 hours during the night without breastfeeding.  You should breastfeed your baby a minimum of 8 times in a 24-hour period until you begin to introduce solid foods to your baby at around 38 months of age. BREAST MILK PUMPING Pumping and storing breast milk allows you to ensure that your baby is exclusively fed your breast milk, even at times when you are unable to breastfeed. This is especially important if you are going back to work while you are still breastfeeding or when you are not able to be present during feedings.  Your lactation consultant can give you guidelines on how long it is safe to store breast milk.  A breast pump is a machine that allows you to pump milk from your breast into a sterile bottle. The pumped breast milk can then be stored in a refrigerator or freezer. Some breast pumps are operated by hand, while others use electricity. Ask your lactation consultant which type will work best for you. Breast pumps can be purchased, but some hospitals and breastfeeding support groups lease breast pumps on a monthly basis. A lactation consultant can teach you how to hand express breast milk, if you prefer not to use a pump.  CARING FOR YOUR BREASTS WHILE YOU BREASTFEED Nipples  can become dry, cracked, and sore while breastfeeding. The following recommendations can help keep your breasts moisturized and healthy:  Avoid using soap on your nipples.   Wear a supportive bra. Although not required, special nursing bras and tank tops are designed to allow access to your breasts for breastfeeding without taking off your entire bra or top. Avoid wearing underwire-style bras or extremely tight bras.  Air dry your nipples for 3-74minutes after each feeding.   Use only cotton bra pads to absorb leaked breast milk. Leaking of breast milk between feedings is normal.   Use lanolin on your nipples after breastfeeding. Lanolin helps to maintain your skin's normal moisture barrier. If you use pure lanolin, you do not need to wash it off before feeding your baby again. Pure lanolin is not toxic to your baby. You may also hand express a few drops of breast milk and gently massage that milk into your nipples and allow the milk to air dry. In the first few weeks after giving birth, some women experience extremely full breasts (engorgement). Engorgement can make your breasts feel heavy, warm, and tender to the touch. Engorgement peaks within 3-5 days after you give birth. The following recommendations can help ease engorgement:  Completely empty your breasts while breastfeeding or pumping. You may want to start by applying warm, moist heat (in the shower or with warm water-soaked hand towels) just before feeding or pumping. This increases circulation and helps the milk flow. If your baby does not completely empty your breasts while breastfeeding, pump any extra milk after he or she is finished.  Wear a snug bra (nursing or regular) or tank top for 1-2 days to signal your body to slightly decrease milk production.  Apply ice packs to your breasts, unless this is too uncomfortable for you.  Make sure that your baby is latched on and positioned properly while breastfeeding. If engorgement  persists after 48 hours of following these recommendations, contact your health care provider or a Science writer. OVERALL HEALTH CARE RECOMMENDATIONS WHILE BREASTFEEDING  Eat healthy foods. Alternate between meals and snacks, eating 3 of each per day. Because what you eat affects your breast milk, some of the foods may make your baby more irritable than usual. Avoid eating these foods if you are sure that they are negatively affecting your baby.  Drink milk, fruit juice, and water to satisfy your thirst (about 10 glasses a day).   Rest often, relax, and continue to take your prenatal vitamins to prevent fatigue, stress, and anemia.  Continue breast self-awareness checks.  Avoid chewing and smoking tobacco.  Avoid alcohol and drug use. Some medicines that may be harmful to your baby can pass through breast milk. It is important to ask your health care provider before taking any medicine, including  all over-the-counter and prescription medicine as well as vitamin and herbal supplements. It is possible to become pregnant while breastfeeding. If birth control is desired, ask your health care provider about options that will be safe for your baby. SEEK MEDICAL CARE IF:   You feel like you want to stop breastfeeding or have become frustrated with breastfeeding.  You have painful breasts or nipples.  Your nipples are cracked or bleeding.  Your breasts are red, tender, or warm.  You have a swollen area on either breast.  You have a fever or chills.  You have nausea or vomiting.  You have drainage other than breast milk from your nipples.  Your breasts do not become full before feedings by the fifth day after you give birth.  You feel sad and depressed.  Your baby is too sleepy to eat well.  Your baby is having trouble sleeping.   Your baby is wetting less than 3 diapers in a 24-hour period.  Your baby has less than 3 stools in a 24-hour period.  Your baby's skin or the  white part of his or her eyes becomes yellow.   Your baby is not gaining weight by 23 days of age. SEEK IMMEDIATE MEDICAL CARE IF:   Your baby is overly tired (lethargic) and does not want to wake up and feed.  Your baby develops an unexplained fever. Document Released: 08/27/2005 Document Revised: 09/01/2013 Document Reviewed: 02/18/2013 [facility] Patient Information 2015 [place], [place]. This information is not intended to replace advice given to you by your health care provider. Make sure you discuss any questions you have with your health care provider.

## 2015-03-20 ENCOUNTER — Inpatient Hospital Stay (HOSPITAL_COMMUNITY)
Admission: AD | Admit: 2015-03-20 | Discharge: 2015-03-20 | Disposition: A | Discharge status: Home / Self Care | Payer: Managed Care, Other (non HMO) | Source: Ambulatory Visit | Attending: Obstetrics and Gynecology | Admitting: Obstetrics and Gynecology

## 2015-03-20 ENCOUNTER — Encounter (HOSPITAL_COMMUNITY): Payer: Self-pay | Admitting: *Deleted

## 2015-03-20 DIAGNOSIS — O9989 Other specified diseases and conditions complicating pregnancy, childbirth and the puerperium: Secondary | ICD-10-CM | POA: Diagnosis present

## 2015-03-20 DIAGNOSIS — R52 Pain, unspecified: Secondary | ICD-10-CM

## 2015-03-20 NOTE — MAU Provider Note (Signed)
Tasha FaviaChelsea Vought is a 19 y.o. G1P1 PP x 4 weeks. Arrived in MAU unannounced c/t a loose stitch at the site of the repair.     History     Patient Active Problem List   Diagnosis Date Noted  . Vaginal delivery 02/16/2015  . 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 H/O umbilical hernia repair--age 19 02/15/2015  . Positive GBS test 02/13/2015  . Overweight (BMI 25.0-29.9) 06/11/2014    No chief complaint on file.  HPI  OB History    Gravida Para Term Preterm AB TAB SAB Ectopic Multiple Living   1 1 1   0  0  0 1      Past Medical History  Diagnosis Date  . Hx of varicella     Past Surgical History  Procedure Laterality Date  . Umbilical hernia repair      Family History  Problem Relation Age of Onset  . Hypertension Father   . Diabetes Maternal Grandmother   . Diabetes Paternal Grandmother   . Cancer Paternal Grandfather     History  Substance Use Topics  . Smoking status: Never Smoker   . Smokeless tobacco: Never Used  . Alcohol Use: No    Allergies: No Known Allergies  Prescriptions prior to admission  Medication Sig Dispense Refill Last Dose  . ferrous sulfate 325 (65 FE) MG tablet Take 1 tablet (325 mg total) by mouth 2 (two) times daily with a meal. 60 tablet 3   . ibuprofen (ADVIL,MOTRIN) 600 MG tablet Take 1 tablet (600 mg total) by mouth every 6 (six) hours. 30 tablet 0   . oxyCODONE-acetaminophen (PERCOCET/ROXICET) 5-325 MG per tablet Take 1 tablet by mouth every 4 (four) hours as needed (for pain scale 4-7). 30 tablet 0   . Prenatal Vit-Fe Fumarate-FA (PRENATAL MULTIVITAMIN) TABS tablet Take 1 tablet by mouth daily at 12 noon. 60 tablet 3     ROS See HPI above, all other systems are negative  Physical Exam   Blood pressure 123/62, pulse 85, temperature 98.5 F (36.9 C), temperature source Oral, resp. rate 15, height 5\' 4"  (1.626 m), weight 180 lb (81.647 kg), last menstrual period 03/20/2015, SpO2 100 %, currently  breastfeeding.  Physical Exam Ext:  WNL ABD: Soft, non tender to palpation, no rebound or guarding SVE: not done Remainder of a dissolved stitched on the right labial   ED Course  Assessment: Normal vaginal healing   Plan: DC to home Pt to keep her 6 week pp visit  Lleyton Byers, CNM, MSN 03/20/2015. 8:29 PM

## 2015-03-20 NOTE — MAU Note (Signed)
Pt reports she had a vaginal delivery one month ago and she had sutures and they have still not disolved.

## 2015-05-03 IMAGING — US US TRANSVAGINAL NON-OB
1 series · 13 of 25 positions shown · non-contrast
Comparison: No similar prior exam is available at this institution
for comparison or on [HOSPITAL] PACS.

CLINICAL DATA: Vaginal bleeding and irregular periods. Currently on
Depo-Provera.

EXAM:
TRANSABDOMINAL AND TRANSVAGINAL ULTRASOUND OF PELVIS
TECHNIQUE: Both transabdominal and transvaginal ultrasound examinations of the
pelvis were performed. Transabdominal technique was performed for
global imaging of the pelvis including uterus, ovaries, adnexal
regions, and pelvic cul-de-sac. It was necessary to proceed with
endovaginal exam following the transabdominal exam to visualize the
endometrium and ovaries.

[Series 1: us pelvis complete · 13 of 40 slices shown]
[im 1/40]
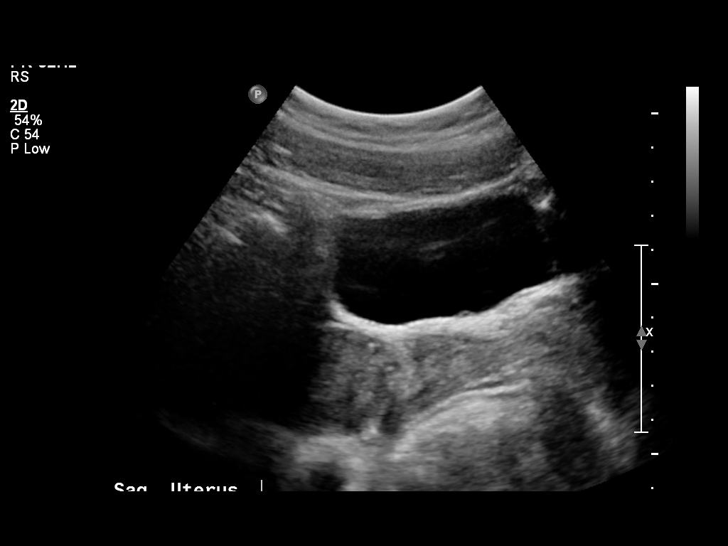
[im 4/40]
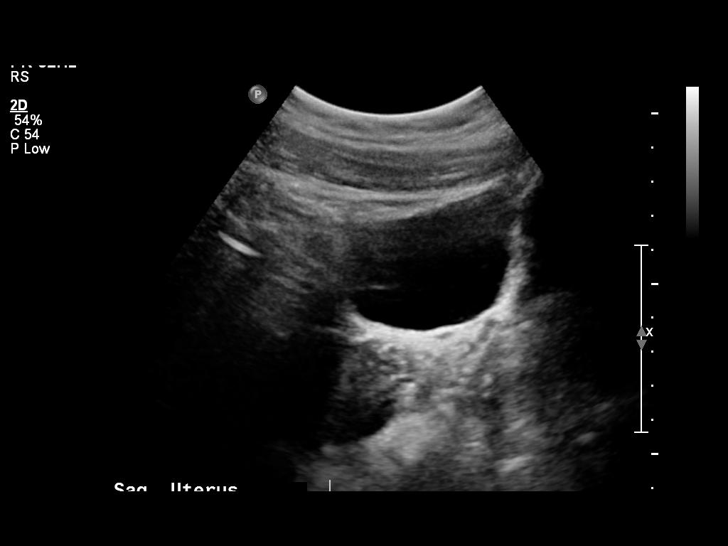
[im 7/40]
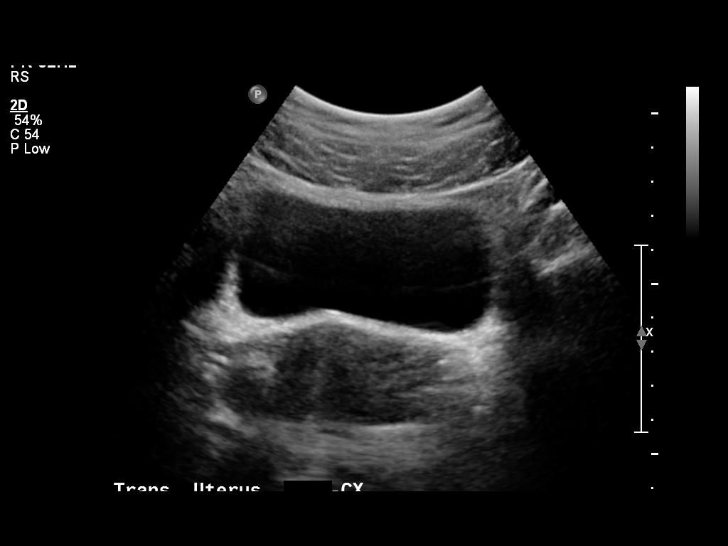
[im 10/40]
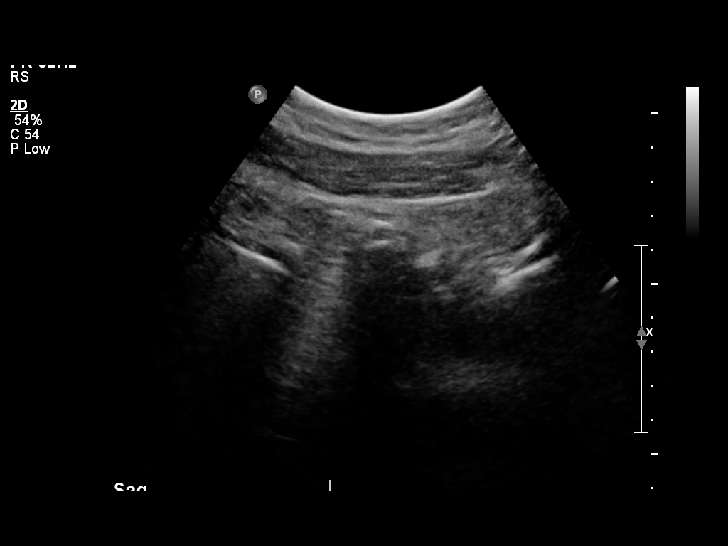
[im 14/40]
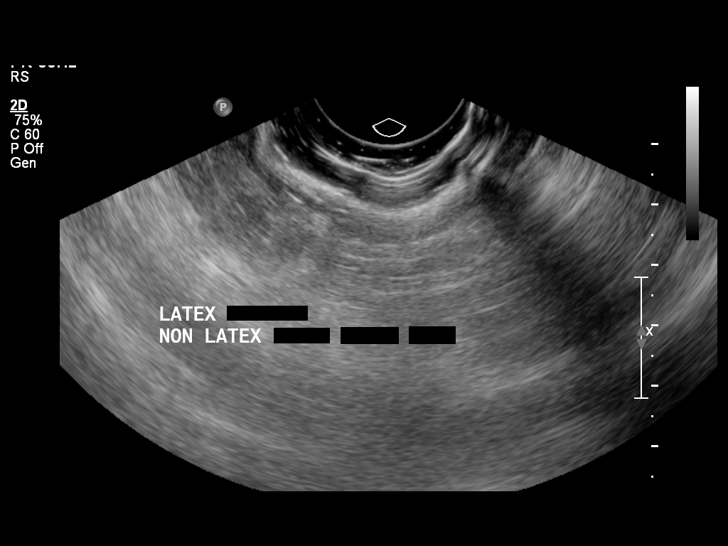
[im 17/40]
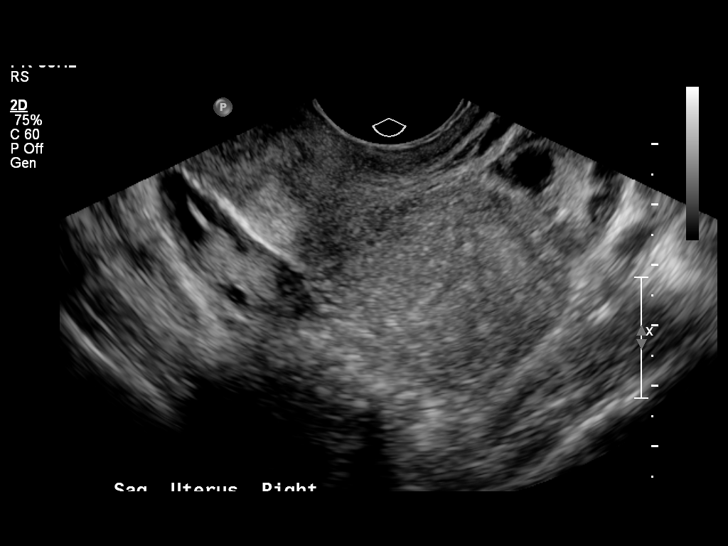
[im 20/40]
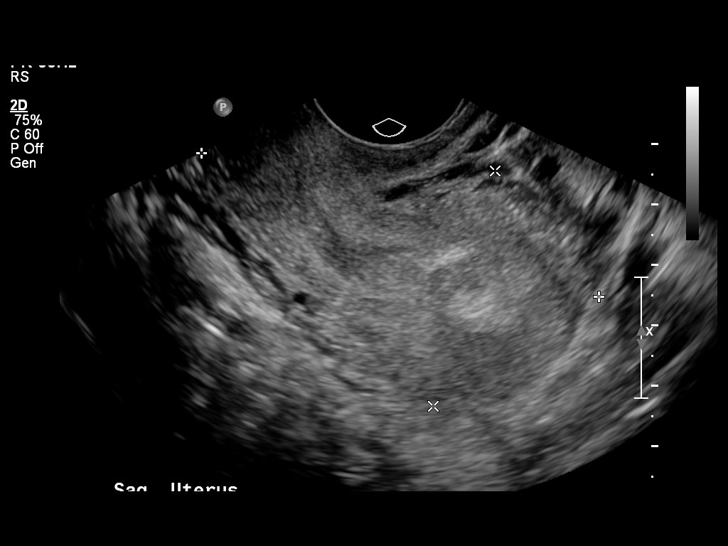
[im 23/40]
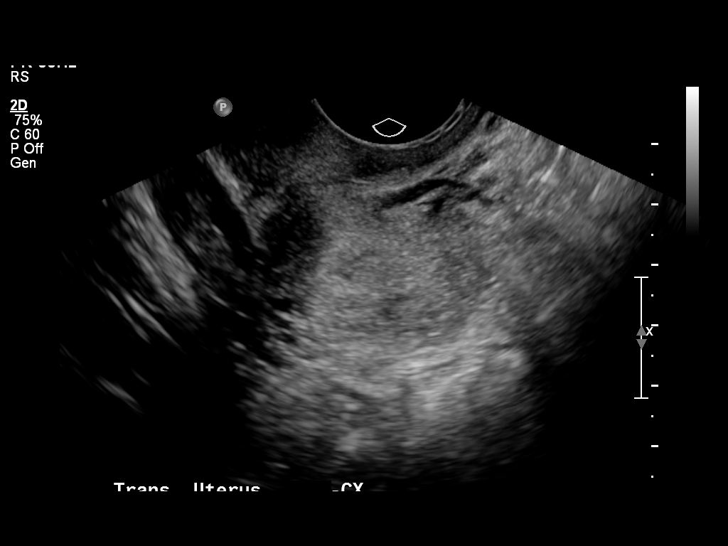
[im 27/40]
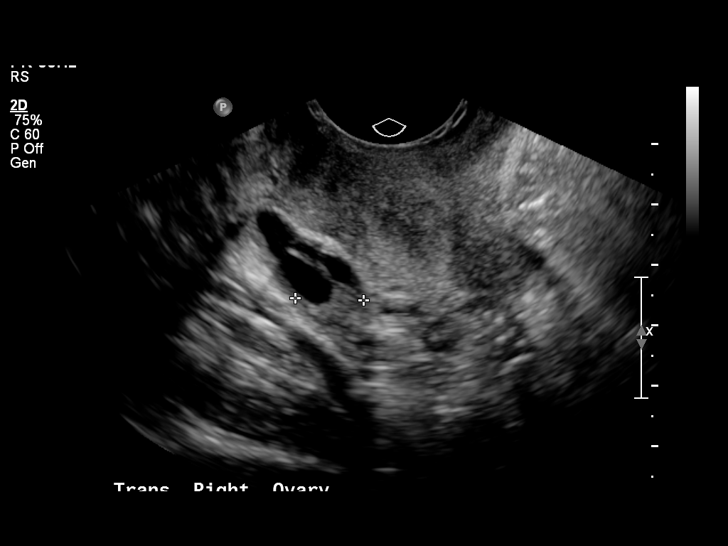
[im 30/40]
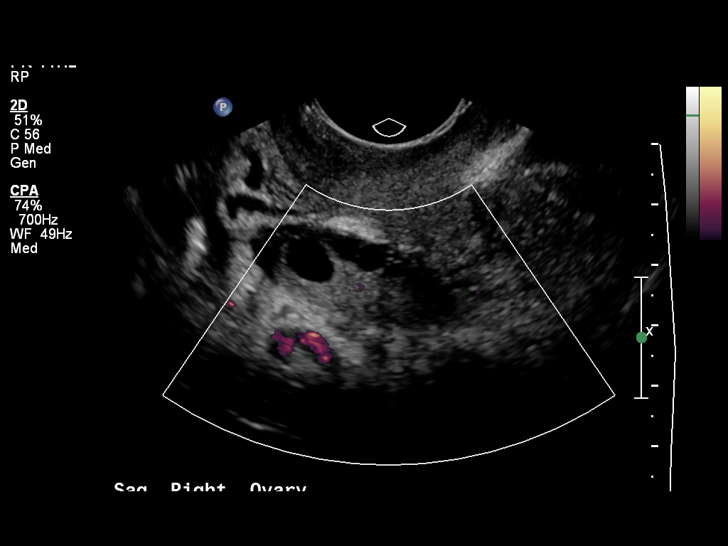
[im 33/40]
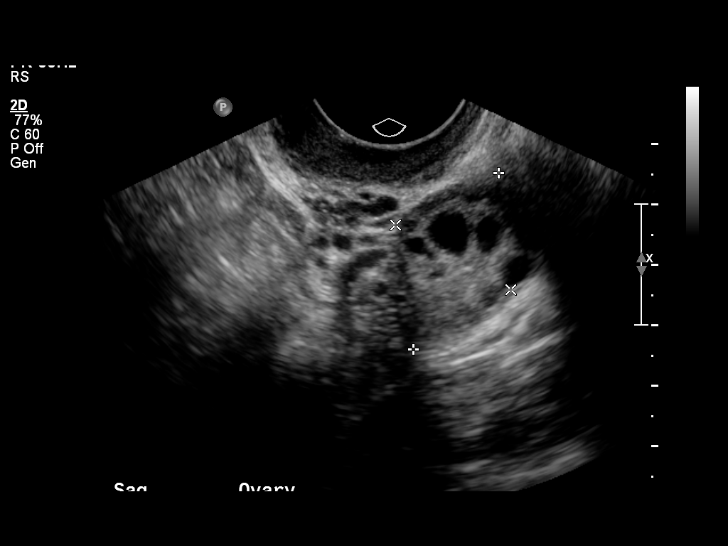
[im 36/40]
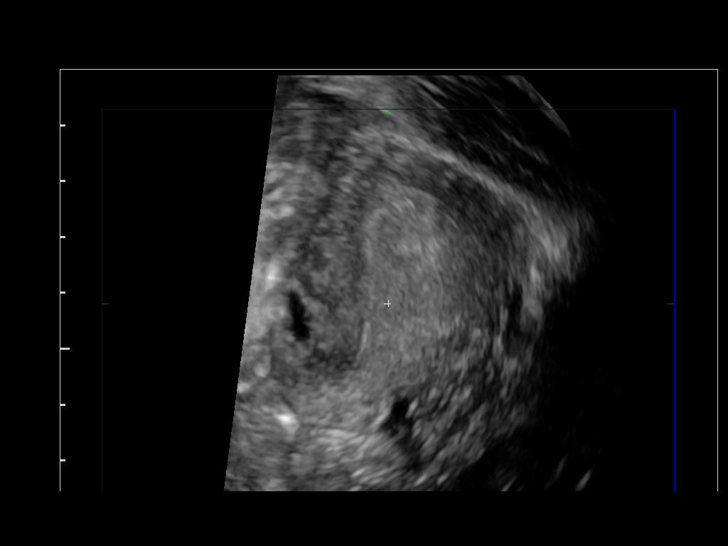
[im 40/40]
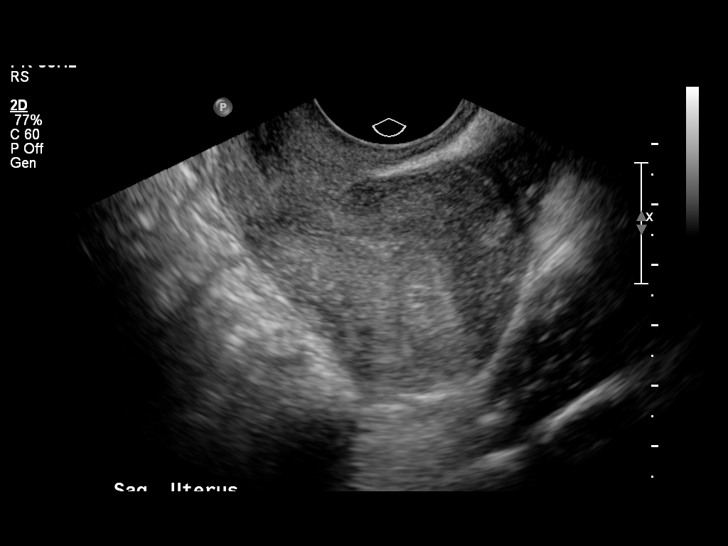

[13 of 25 positions shown; findings below may reference images not displayed]

FINDINGS: Uterus

Measurements: 7.0 x 4.1 x 4.0 cm. Retroverted, retroflexed. No
fibroids or other mass visualized.

Endometrium

Thickness: 1.4 cm. Mildly inhomogeneous without focal measurable
abnormality.

Right ovary

Measurements: 3.8 x 1.5 x 1.1 cm.. Normal appearance/no adnexal
mass.

Left ovary

Measurements: 3.2 x 2.2 x 2.0 cm. Normal appearance/no adnexal mass.

Other findings

No free fluid.
IMPRESSION: Mildly inhomogeneous endometrium without measurable focal
abnormality. This could be due to timing with respect to the
patient's cycle, although polyp, hyperplasia/neoplasia, or
submucosal fibroid could appear similar. Consider reimaging during
the week following the patient's menses if possible in 4-6 weeks.

## 2015-05-06 ENCOUNTER — Inpatient Hospital Stay (HOSPITAL_COMMUNITY)
Admission: AD | Admit: 2015-05-06 | Discharge: 2015-05-06 | Disposition: A | Discharge status: Home / Self Care | Payer: Managed Care, Other (non HMO) | Source: Ambulatory Visit | Attending: Obstetrics and Gynecology | Admitting: Obstetrics and Gynecology

## 2015-05-06 ENCOUNTER — Encounter (HOSPITAL_COMMUNITY): Payer: Self-pay | Admitting: *Deleted

## 2015-05-06 DIAGNOSIS — R102 Pelvic and perineal pain: Secondary | ICD-10-CM | POA: Insufficient documentation

## 2015-05-06 DIAGNOSIS — N898 Other specified noninflammatory disorders of vagina: Secondary | ICD-10-CM | POA: Diagnosis present

## 2015-05-06 DIAGNOSIS — B373 Candidiasis of vulva and vagina: Secondary | ICD-10-CM | POA: Diagnosis not present

## 2015-05-06 DIAGNOSIS — B3731 Acute candidiasis of vulva and vagina: Secondary | ICD-10-CM

## 2015-05-06 HISTORY — DX: Chlamydial infection, unspecified: A74.9

## 2015-05-06 HISTORY — DX: Gonococcal infection, unspecified: A54.9

## 2015-05-06 HISTORY — DX: Unspecified infectious disease: B99.9

## 2015-05-06 LAB — URINALYSIS, ROUTINE W REFLEX MICROSCOPIC
Bilirubin Urine: NEGATIVE
Glucose, UA: NEGATIVE mg/dL
Hgb urine dipstick: NEGATIVE
KETONES UR: NEGATIVE mg/dL
NITRITE: NEGATIVE
PH: 6 (ref 5.0–8.0)
PROTEIN: NEGATIVE mg/dL
Specific Gravity, Urine: >1.03 — ABNORMAL HIGH (ref 1.005–1.030)
Urobilinogen, UA: 1 mg/dL (ref 0.0–1.0)

## 2015-05-06 LAB — URINE MICROSCOPIC-ADD ON

## 2015-05-06 LAB — WET PREP, GENITAL
CLUE CELLS WET PREP: NONE SEEN
Trich, Wet Prep: NONE SEEN
YEAST WET PREP: NONE SEEN

## 2015-05-06 LAB — POCT PREGNANCY, URINE: Preg Test, Ur: NEGATIVE

## 2015-05-06 MED ORDER — FLUCONAZOLE 150 MG PO TABS: 150.0000 mg | ORAL_TABLET | Freq: Once | ORAL | Status: DC

## 2015-05-06 MED ORDER — FLUCONAZOLE 150 MG PO TABS
150.0000 mg | ORAL_TABLET | Freq: Once | ORAL | Status: AC
  Administered 2015-05-06: 150 mg via ORAL
  Filled 2015-05-06: qty 1

## 2015-05-06 NOTE — Discharge Instructions (Signed)

## 2015-05-06 NOTE — MAU Note (Signed)
Been having a lot of discharge, started 3 days ago. Is having a lot of burning and irritation.  Has appt for next Thursday, can not wait.

## 2015-05-06 NOTE — MAU Provider Note (Signed)
History    Tasha Chen is a 19y.o. who presents, unannounced, for vaginal discharge.  Patient states symptoms started about a week ago and have been getting increasingly worse.  Patient states discharge is "thick white looking" and reports mild odor.  Patient reports some burning with urination, but denies frequency or hesitancy.  Patient denies issues with bowel movements.  Patient reports sexual intercourse about 1 week ago with same partner.  Patient states she is on transdermal patch for [facility] and started this about two weeks.    Patient Active Problem List   Diagnosis Date Noted  . Vaginal delivery 02/16/2015  . 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 H/O umbilical hernia repair--age 20 02/15/2015  . Positive GBS test 02/13/2015  . Overweight (BMI 25.0-29.9) 06/11/2014    Chief Complaint  Patient presents with  . Vaginal Discharge  . Vaginal Pain   HPI  OB History    Gravida Para Term Preterm AB TAB SAB Ectopic Multiple Living   1 1 1   0  0  0 1      Past Medical History  Diagnosis Date  . Hx of varicella   . Infection     urination  . Gonorrhea   . Chlamydia     Past Surgical History  Procedure Laterality Date  . Umbilical hernia repair      Family History  Problem Relation Age of Onset  . Hypertension Father   . Diabetes Maternal Grandmother   . Diabetes Paternal Grandmother   . Cancer Paternal Grandfather   . Cancer Maternal Aunt     Social History  Substance Use Topics  . Smoking status: Never Smoker   . Smokeless tobacco: Never Used  . Alcohol Use: No    Allergies: No Known Allergies  Prescriptions prior to admission  Medication Sig Dispense Refill Last Dose  . ferrous sulfate 325 (65 FE) MG tablet Take 1 tablet (325 mg total) by mouth 2 (two) times daily with a meal. 60 tablet 3   . ibuprofen (ADVIL,MOTRIN) 600 MG tablet Take 1 tablet (600 mg total) by mouth every 6 (six) hours. 30 tablet 0   . oxyCODONE-acetaminophen  (PERCOCET/ROXICET) 5-325 MG per tablet Take 1 tablet by mouth every 4 (four) hours as needed (for pain scale 4-7). 30 tablet 0   . Prenatal Vit-Fe Fumarate-FA (PRENATAL MULTIVITAMIN) TABS tablet Take 1 tablet by mouth daily at 12 noon. 60 tablet 3     ROS Physical Exam   Blood pressure 121/78, pulse 75, temperature 98.5 F (36.9 C), temperature source Oral, resp. rate 18, not currently breastfeeding.    Physical Exam  Genitourinary: Rectum normal. Rectal exam shows no tenderness. Cervix exhibits discharge. Cervix exhibits no motion tenderness. Right adnexum displays no tenderness. Left adnexum displays no tenderness. No bleeding in the vagina. Vaginal discharge found.  Speculum Exam: -Vulva: Appears inflamed, moderate amt of discharge noted at introitus -Vaginal Vault: Large amt thick whitish green curdy discharge-wet prep collected -Cervix: Thin clear discharge from os-GC/CT collected Bimanual Exam: Normal Adnexa      ED Course  Assessment: 19 y.o. Female Vaginal yeast infection  Plan: PE as above Will treat for yeast based on exam Diflucan 150mg  now Will send rx to pharmacy for repeat in 48 hours Labs; Wet prep, GC/CT, HiV, RPR, Hep C Discussed vaginal hygiene No other questions or concerns Will await wet prep Informed of delay on other testing and when to expect results Encouraged to call if any questions or concerns  arise prior to next scheduled office visit.  Discharged to home in improved condition  Swara Donze LYNN CNM, MSN 05/06/2015 6:21 PM

## 2015-05-07 LAB — RPR: RPR Ser Ql: NONREACTIVE

## 2015-05-07 LAB — HIV ANTIBODY (ROUTINE TESTING W REFLEX): HIV SCREEN 4TH GENERATION: NONREACTIVE

## 2015-05-07 LAB — HEPATITIS C ANTIBODY: HCV Ab: <0.1 s/co ratio (ref 0.0–0.9)

## 2015-05-09 LAB — GC/CHLAMYDIA PROBE AMP (~~LOC~~) NOT AT ARMC
Chlamydia: NEGATIVE
Neisseria Gonorrhea: NEGATIVE

## 2015-10-02 IMAGING — US US TRANSVAGINAL NON-OB
1 series · 14 of 25 positions shown · non-contrast
Comparison: 09/05/2013

CLINICAL DATA: Lower abdominal and pelvic pain.  LMP 01/25/2014.

EXAM:
TRANSABDOMINAL AND TRANSVAGINAL ULTRASOUND OF PELVIS
TECHNIQUE: Both transabdominal and transvaginal ultrasound examinations of the
pelvis were performed. Transabdominal technique was performed for
global imaging of the pelvis including uterus, ovaries, adnexal
regions, and pelvic cul-de-sac. It was necessary to proceed with
endovaginal exam following the transabdominal exam to visualize the
endometrium and ovaries.

[Series 1: us pelvis complete · 14 of 37 slices shown]
[im 1/37]
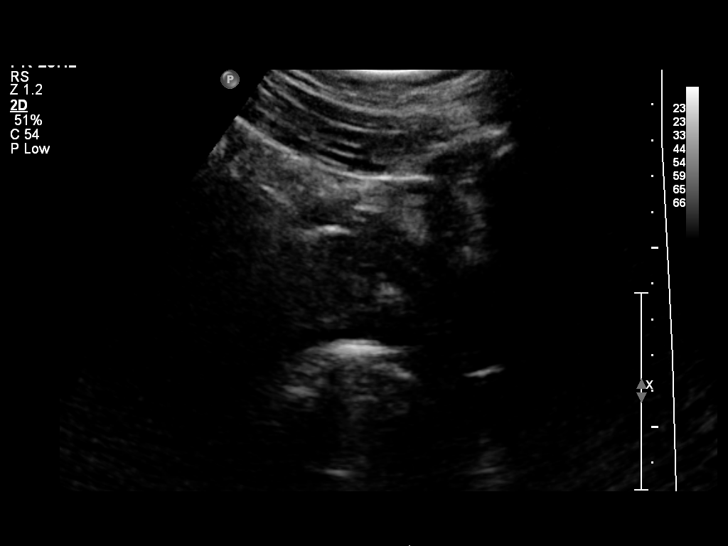
[im 4/37]
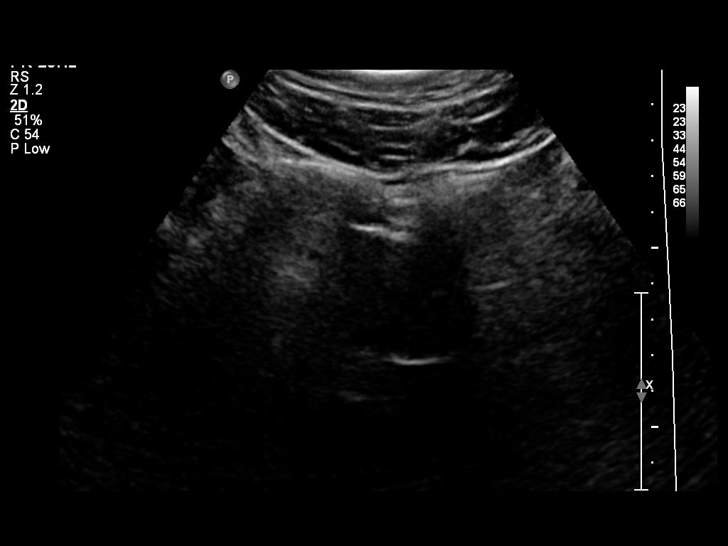
[im 7/37]
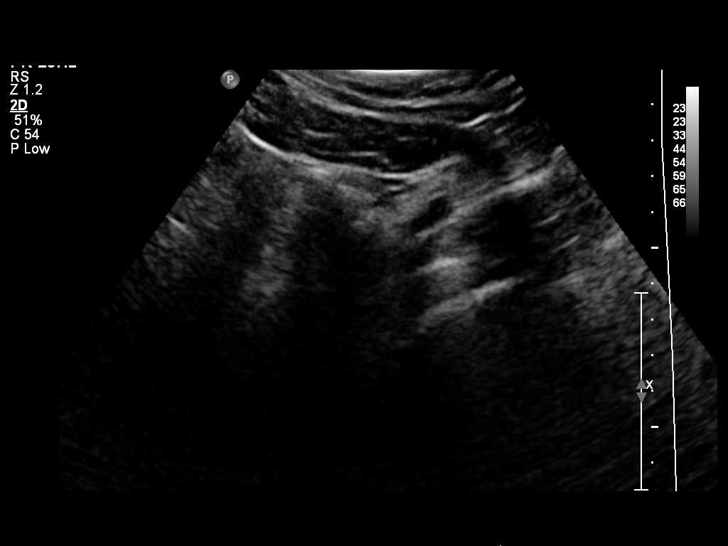
[im 10/37]
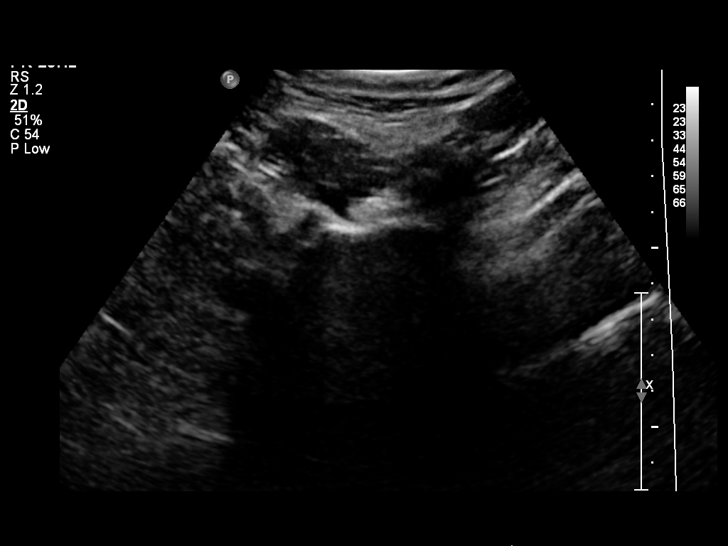
[im 13/37]
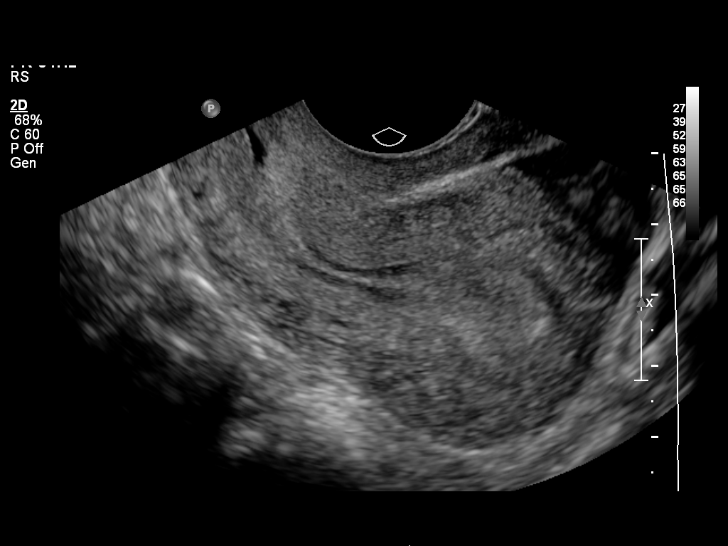
[im 14/37]
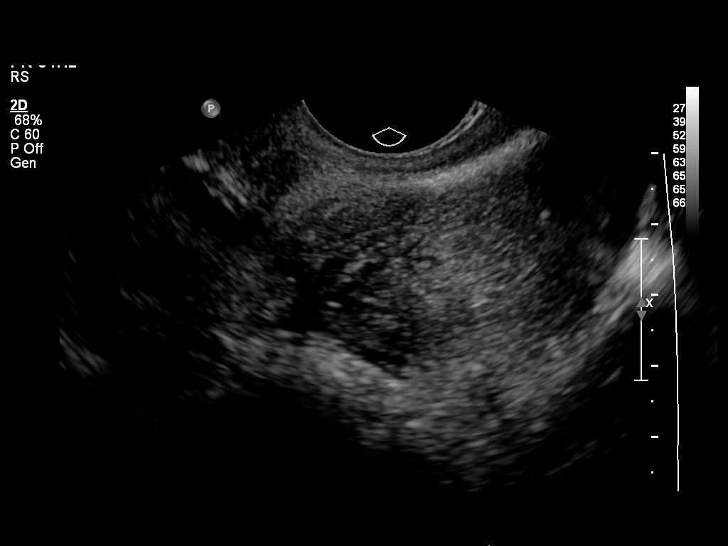
[im 17/37]
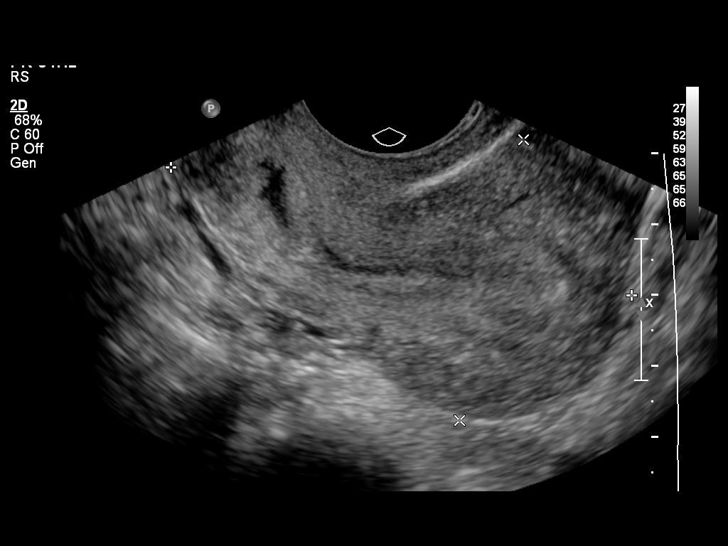
[im 20/37]
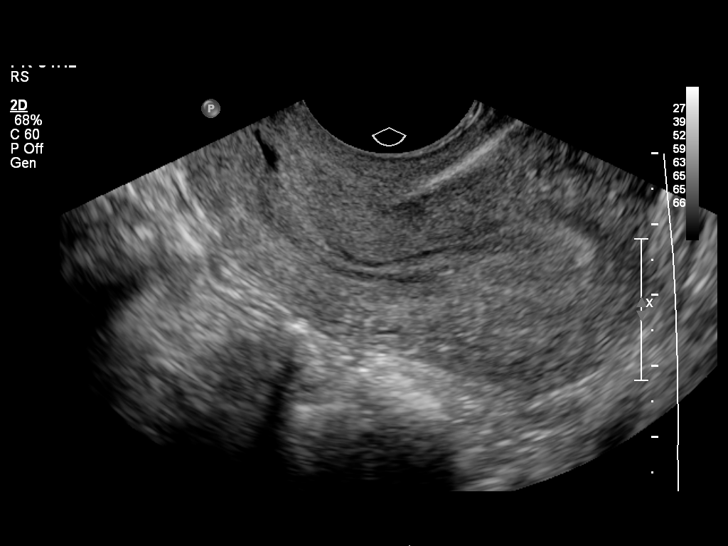
[im 23/37]
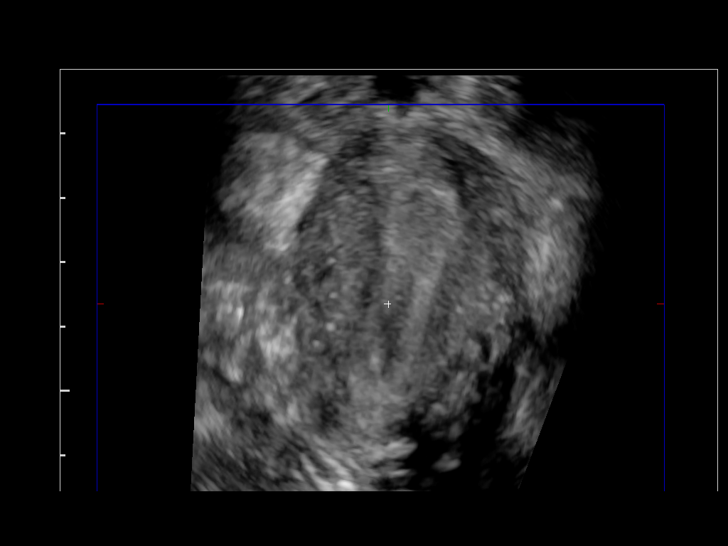
[im 25/37]
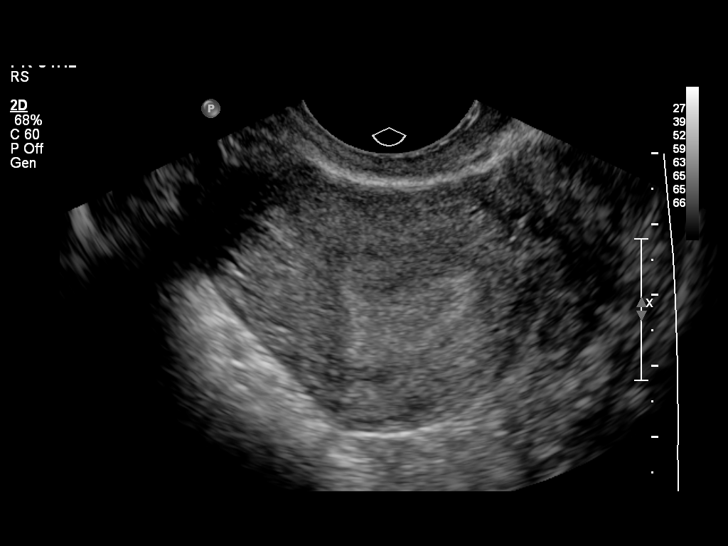
[im 28/37]
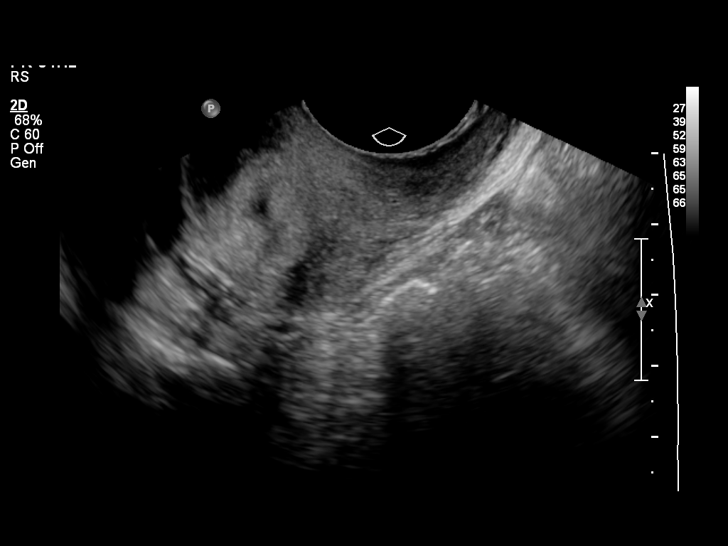
[im 31/37]
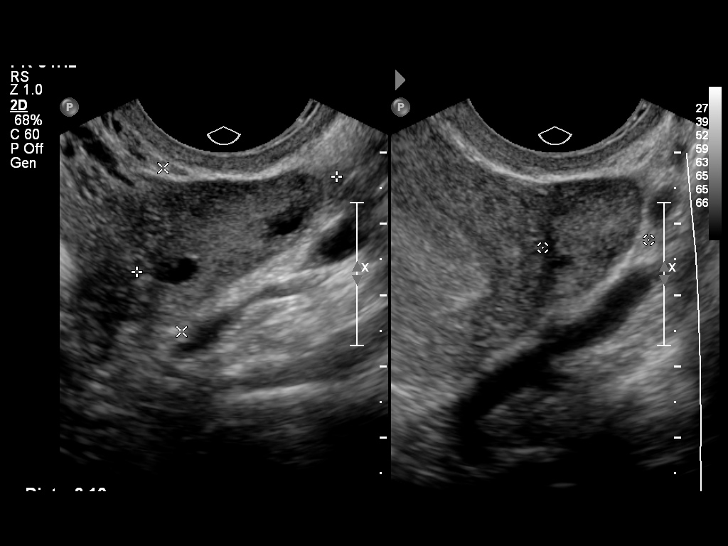
[im 34/37]
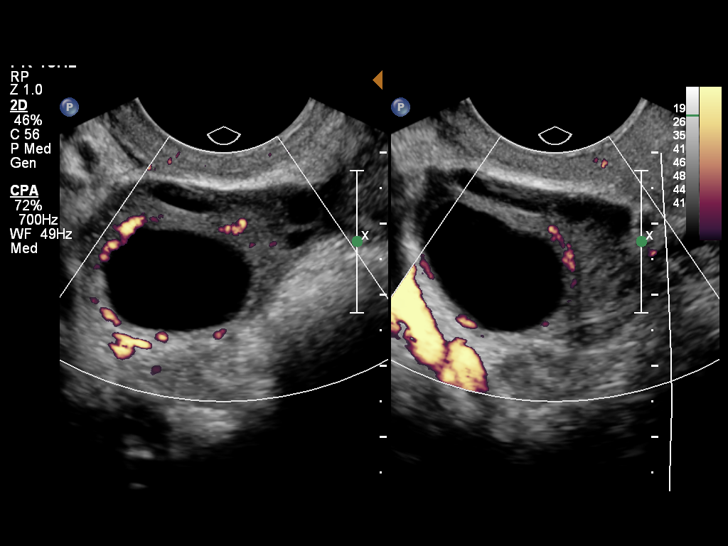
[im 37/37]
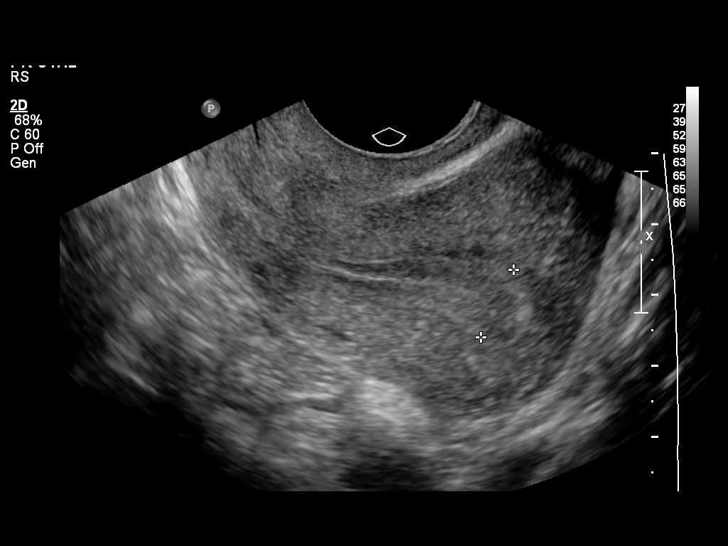

[14 of 25 positions shown; findings below may reference images not displayed]

FINDINGS: Uterus

Measurements: 6.8 x 4.1 x 4.0 cm. Retroverted. No fibroids or other
mass visualized.

Endometrium

Thickness: 11 mm.  No focal abnormality visualized.

Right ovary

Measurements: 3.1 x 2.3 x 1.5 cm. Normal appearance/no adnexal mass.

Left ovary

Measurements: 3.6 x 2.2 x 3.1 cm. 2.4 cm follicle noted. Normal
appearance/no adnexal mass.

Other findings

No free fluid.
IMPRESSION: Normal appearance of uterus and both ovaries. No pelvic mass or
other significant abnormality identified.

## 2015-11-12 ENCOUNTER — Inpatient Hospital Stay (HOSPITAL_COMMUNITY)
Admission: AD | Admit: 2015-11-12 | Discharge: 2015-11-12 | Disposition: A | Discharge status: Home / Self Care | Payer: Managed Care, Other (non HMO) | Source: Ambulatory Visit | Attending: Certified Nurse Midwife | Admitting: Certified Nurse Midwife

## 2015-11-12 DIAGNOSIS — N899 Noninflammatory disorder of vagina, unspecified: Secondary | ICD-10-CM | POA: Diagnosis not present

## 2015-11-12 LAB — WET PREP, GENITAL
Sperm: NONE SEEN
Trich, Wet Prep: NONE SEEN
Yeast Wet Prep HPF POC: NONE SEEN

## 2015-11-12 LAB — URINALYSIS, ROUTINE W REFLEX MICROSCOPIC
BILIRUBIN URINE: NEGATIVE
GLUCOSE, UA: NEGATIVE mg/dL
HGB URINE DIPSTICK: NEGATIVE
KETONES UR: NEGATIVE mg/dL
Nitrite: NEGATIVE
PH: 6 (ref 5.0–8.0)
PROTEIN: NEGATIVE mg/dL
Specific Gravity, Urine: 1.025 (ref 1.005–1.030)

## 2015-11-12 LAB — POCT PREGNANCY, URINE: Preg Test, Ur: NEGATIVE

## 2015-11-12 LAB — URINE MICROSCOPIC-ADD ON

## 2015-11-12 MED ORDER — METRONIDAZOLE 500 MG PO TABS: 500.0000 mg | ORAL_TABLET | Freq: Two times a day (BID) | ORAL | Status: AC

## 2015-11-12 NOTE — MAU Note (Signed)
Ever since having her daughter in June 2016 has had an vaginal odor was told had BV in August took medication, has continued with an odor, no vaginal discharge.  LMP 10/30/15

## 2015-11-14 LAB — GC/CHLAMYDIA PROBE AMP (~~LOC~~) NOT AT ARMC
Chlamydia: POSITIVE — AB
Neisseria Gonorrhea: NEGATIVE

## 2015-12-06 ENCOUNTER — Emergency Department (INDEPENDENT_AMBULATORY_CARE_PROVIDER_SITE_OTHER)
Admission: EM | Admit: 2015-12-06 | Discharge: 2015-12-06 | Disposition: A | Discharge status: Home / Self Care | Payer: Managed Care, Other (non HMO) | Source: Home / Self Care | Attending: Family Medicine | Admitting: Family Medicine

## 2015-12-06 ENCOUNTER — Other Ambulatory Visit (HOSPITAL_COMMUNITY)
Admission: RE | Admit: 2015-12-06 | Discharge: 2015-12-06 | Disposition: A | Discharge status: Home / Self Care | Payer: Managed Care, Other (non HMO) | Source: Ambulatory Visit | Attending: Family Medicine | Admitting: Family Medicine

## 2015-12-06 ENCOUNTER — Encounter (HOSPITAL_COMMUNITY): Payer: Self-pay | Admitting: Emergency Medicine

## 2015-12-06 DIAGNOSIS — N949 Unspecified condition associated with female genital organs and menstrual cycle: Secondary | ICD-10-CM

## 2015-12-06 DIAGNOSIS — B373 Candidiasis of vulva and vagina: Secondary | ICD-10-CM | POA: Diagnosis not present

## 2015-12-06 DIAGNOSIS — N760 Acute vaginitis: Secondary | ICD-10-CM | POA: Insufficient documentation

## 2015-12-06 DIAGNOSIS — Z113 Encounter for screening for infections with a predominantly sexual mode of transmission: Secondary | ICD-10-CM | POA: Insufficient documentation

## 2015-12-06 DIAGNOSIS — B3731 Acute candidiasis of vulva and vagina: Secondary | ICD-10-CM

## 2015-12-06 LAB — POCT URINALYSIS DIP (DEVICE)
BILIRUBIN URINE: NEGATIVE
Glucose, UA: NEGATIVE mg/dL
Ketones, ur: NEGATIVE mg/dL
Leukocytes, UA: NEGATIVE
NITRITE: NEGATIVE
PH: 6.5 (ref 5.0–8.0)
PROTEIN: 30 mg/dL — AB
Specific Gravity, Urine: >=1.03 (ref 1.005–1.030)
UROBILINOGEN UA: 0.2 mg/dL (ref 0.0–1.0)

## 2015-12-06 MED ORDER — FLUCONAZOLE 150 MG PO TABS: 150.0000 mg | ORAL_TABLET | Freq: Once | ORAL | Status: DC

## 2015-12-06 MED ORDER — FLUCONAZOLE 150 MG PO TABS
150.0000 mg | ORAL_TABLET | Freq: Every day | ORAL | Status: DC
Start: 2015-12-06 — End: 2017-08-10

## 2015-12-06 NOTE — ED Notes (Signed)
Patient diagnosed with bv and chlamydia.  Patient has taken medication.  Patient reports irritation and pain .  Has had issues for 3 weeks.

## 2015-12-06 NOTE — Discharge Instructions (Signed)
It is a pleasure to see you today.   As we discussed, your exam is consistent with a vaginal yeast infection.  I am prescribing you DIFLUCAN (fluconazole) 150mg , take 1 tablet by mouth one time.  You may repeat in 1 week if the symptoms recur or do not go away entirely.   I am sending tests for chlamydia, gonorrhea and BV today.  Seeing as you were just [facility] for these infections, it may not be appropriate to re-treat you if they are positive since you have not had intercourse since before being treated.   If they are positive, I recommend having a "test of cure" approximately 3 weeks after you were last treated.  I recommend establishing care with a primary care doctor close to you.

## 2015-12-06 NOTE — ED Provider Notes (Addendum)
CSN: [id number]     Arrival date & time 12/06/15  1536 History   First MD Initiated Contact with Patient 12/06/15 1736     Chief Complaint  Patient presents with  . Vaginal Itching   (Consider location/radiation/quality/duration/timing/severity/associated sxs/prior Treatment) Patient is a 20 y.o. female presenting with vaginal itching. The history is provided by the patient. No language interpreter was used.  Vaginal Itching  Patient with complaint of vaginal burning that has been ongoing for several weeks.  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 Had NSVD 9 months ago (baby girl), after which she had recurrent BV treated with metronidazole off and on.  [facility] and burning, diagnosed with Chlamydia and BV, treated with metronidazole and azithromycin. Began to have more vaginal burning.  [facility] ago (Tue, March 21st) [facility] and given acyclovir for suspected HSV2 outbreak.  She has taken the acyclovir for 5 days but then stopped over the weekend. LMP started on Thurs March 23rd; burning and itching has been worsening since then.  Denies fever or chills. Is not sexually active since the earlier diagnosis of CT on March 4th. Does remark dysuria, unclear if this is due to external burning from urine contact with vaginal mucosa or actual urethral dysuria.   Past Medical History  Diagnosis Date  . Hx of varicella   . Infection     urination  . Gonorrhea   . Chlamydia    Past Surgical History  Procedure Laterality Date  . Umbilical hernia repair     Family History  Problem Relation Age of Onset  . Hypertension Father   . Diabetes Maternal Grandmother   . Diabetes Paternal Grandmother   . Cancer Paternal Grandfather   . Cancer  Maternal Aunt    Social History  Substance Use Topics  . Smoking status: Never Smoker   . Smokeless tobacco: Never Used  . Alcohol Use: No   OB History    Gravida Para Term Preterm AB TAB SAB Ectopic Multiple Living   1 1 1   0  0  0 1     Review of Systems  Allergies  Review of patient's allergies indicates no known allergies.  Home Medications   Prior to Admission medications   Medication Sig Start Date End Date Taking? Authorizing Provider  fluconazole (DIFLUCAN) 150 MG tablet Take 1 tablet (150 mg total) by mouth once. Patient not taking: Reported on 12/06/2015 05/06/15   Gerrit HeckJessica Emly, CNM   Meds Ordered and Administered this Visit  Medications - No data to display  BP 117/79 mmHg  Pulse 60  Temp(Src) 97.9 F (36.6 C) (Oral)  Resp 20  SpO2 100%  LMP 12/01/2015  Breastfeeding? No No data found.   Physical Exam  Constitutional: She appears well-developed and well-nourished. No distress.  Abdominal: Soft. She exhibits no distension. There is no tenderness.  Genitourinary:  External vulvar redness; few satellite lesions along inner thighs. No vesicular lesions or crusting. Dark blood from os. No CMT; no adnexal tenderness. Exquisite tenderness along vaginal vault with exam.   Skin: She is not diaphoretic.    ED Course  Procedures (including critical care time)  Labs Review Labs Reviewed - No data to display  Imaging Review  No results found.   Visual Acuity Review  Right Eye Distance:   Left Eye Distance:   Bilateral Distance:    Right Eye Near:   Left Eye Near:    Bilateral Near:         MDM  No diagnosis found. Patient with recent diagnosis CT, BV; treated one week ago for GC, CT, HSV2; now with worsening burning around vaginal mucosa.  Exam c/w vaginal candidiasis.  To treat accordingly with diflucan.  Will send specimens for CT/GC, BV (wet prep).  She reports that she was treated on March 21st with injection "for Gonorrhea", with 4 pills in  the office "for chlamydia", and acyclovir for herpes.  Would consider test of cure in 2 weeks if any of today's specimens come back positive for CT/GC, as she has not been sexually active since well before treatment.  Would treat BV and trich with Flagyl if positive on this exam.  Discussed all this, including importance of a primary care doctor, with the patient.  She agrees with the plan.     Barbaraann Barthel, MD 12/06/15 1753  Barbaraann Barthel, MD 12/06/15 [phone]

## 2015-12-07 LAB — CERVICOVAGINAL ANCILLARY ONLY
Chlamydia: NEGATIVE
Neisseria Gonorrhea: NEGATIVE
Wet Prep (BD Affirm): POSITIVE — AB

## 2015-12-08 ENCOUNTER — Telehealth: Payer: Self-pay | Admitting: Internal Medicine

## 2015-12-08 DIAGNOSIS — B9689 Other specified bacterial agents as the cause of diseases classified elsewhere: Secondary | ICD-10-CM

## 2015-12-08 DIAGNOSIS — N760 Acute vaginitis: Principal | ICD-10-CM

## 2015-12-08 LAB — URINE CULTURE: Culture: NO GROWTH

## 2015-12-08 MED ORDER — METRONIDAZOLE 500 MG PO TABS: 500.0000 mg | ORAL_TABLET | Freq: Two times a day (BID) | ORAL | Status: AC

## 2015-12-08 NOTE — ED Notes (Signed)
Please let patient know that test for bacterial vaginosis (gardnerella) was positive.  Rx for metronidazole sent to pharmacy of record ([facility] on [address]).  Recheck for persistent symptoms.   Tests for gonorrhea/chlamydia were negative.  LM  Eustace MooreLaura W Rayhan Groleau, MD 12/08/15 1235

## 2015-12-15 ENCOUNTER — Telehealth (HOSPITAL_COMMUNITY): Payer: Self-pay | Admitting: Emergency Medicine

## 2015-12-15 NOTE — ED Notes (Addendum)
Pt  Called      Id  Verified         Pt  Advised      That    The  Medication    Was [facility]         [facility]   [facility]  [facility]       Pt given the  Lab  ressults      And         States   Was  Feeling  Better

## 2015-12-15 NOTE — ED Notes (Signed)
LM on pt's VM [phone]; Called [phone] but NA Need to give lab results from recent visit on 3/28; need to make sure pt was able to p/u Rx and that she's feeling better.   Per Dr. Dayton ScrapeMurray,   Please let patient know that urine culture is negative for UTI. Tests for gonorrhea/chlamydia were negative. Test for gardnerella (bacterial vaginosis) was positive, and rx for metronidazole was sent to the patient's pharmacy of record ([facility] on [address]  Recheck for persistent sx's. LM  Please let patient know that test for bacterial vaginosis (gardnerella) was positive. Rx for metronidazole sent to [facility] record ([facility]  [address] Recheck for persistent symptoms.  Tests for gonorrhea/chlamydia were negative. LM  Will try later.

## 2016-02-06 IMAGING — US US OB TRANSVAGINAL
1 series · 14 of 28 positions shown · non-contrast
Comparison: None.

CLINICAL DATA: Patient with pelvic pain.  Positive pregnancy test.

EXAM:
OBSTETRIC <14 WK US AND TRANSVAGINAL OB US
TECHNIQUE: Both transabdominal and transvaginal ultrasound examinations were
performed for complete evaluation of the gestation as well as the
maternal uterus, adnexal regions, and pelvic cul-de-sac.
Transvaginal technique was performed to assess early pregnancy.

[Series 1: us ob transvaginal · 14 of 46 slices shown]
[im 2/46]
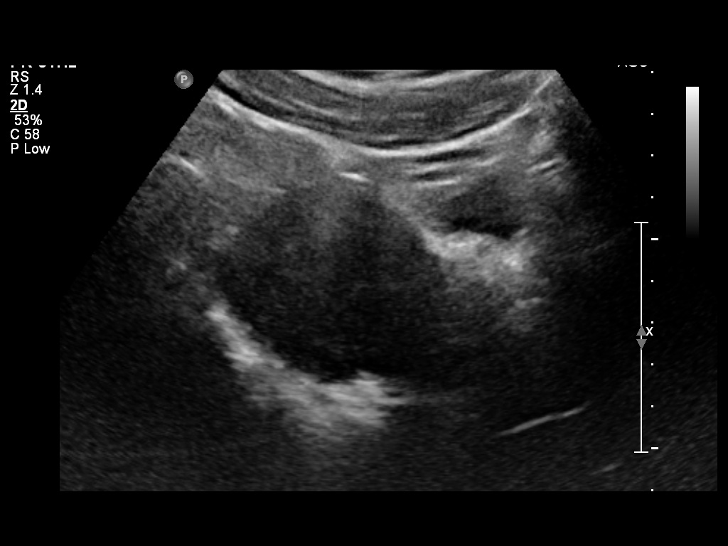
[im 6/46]
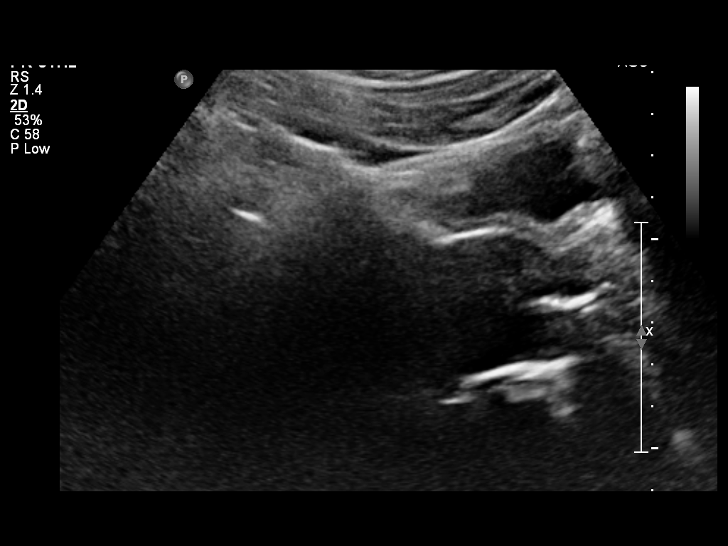
[im 9/46]
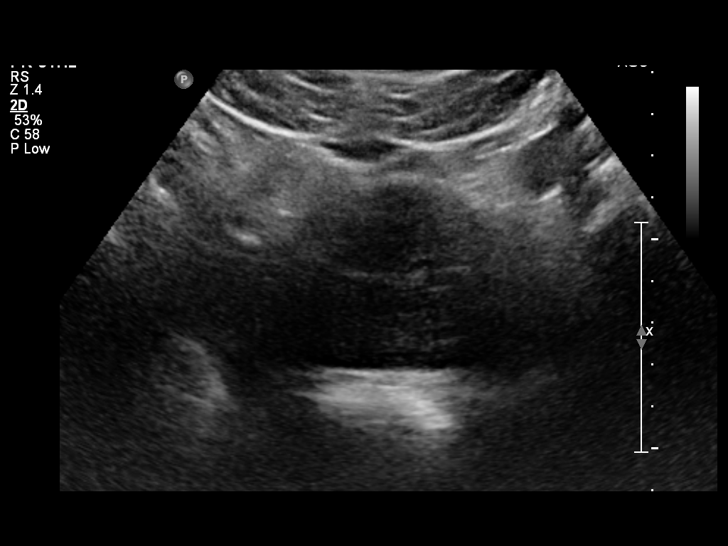
[im 12/46]
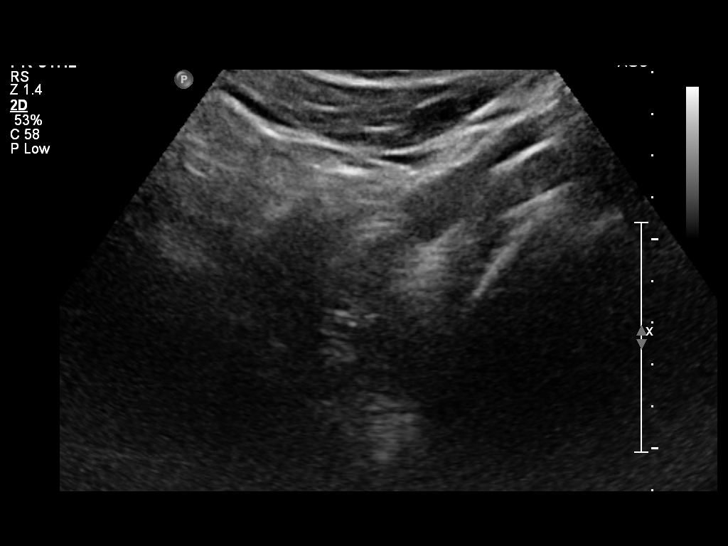
[im 16/46]
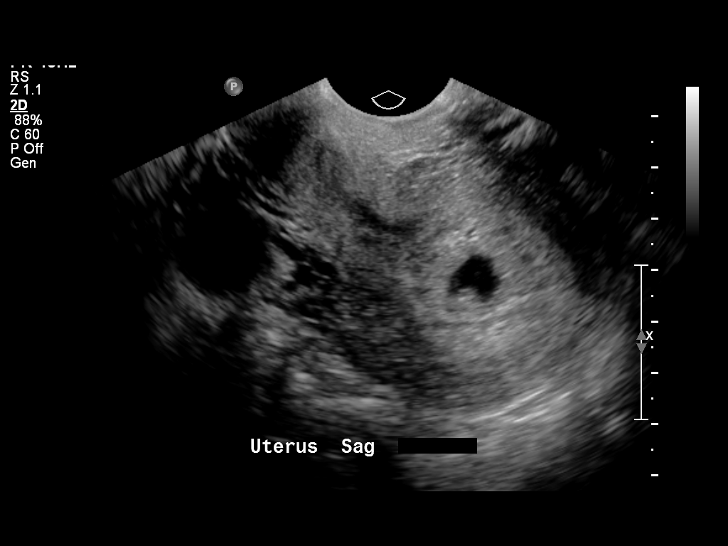
[im 19/46]
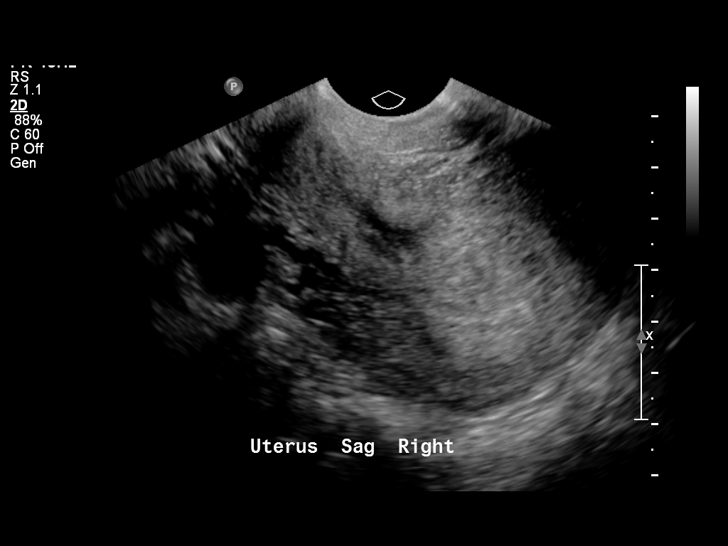
[im 22/46]
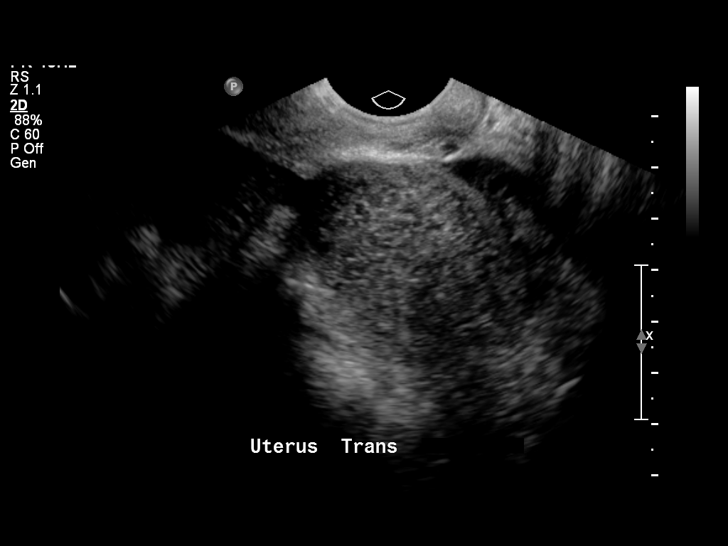
[im 26/46]
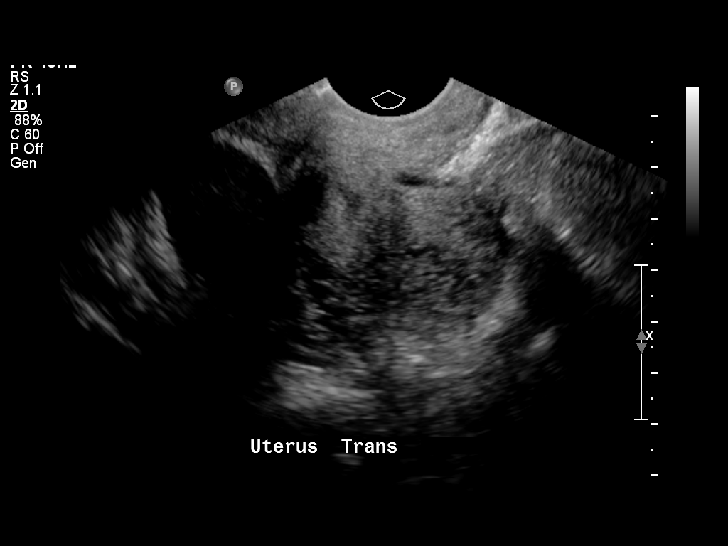
[im 29/46]
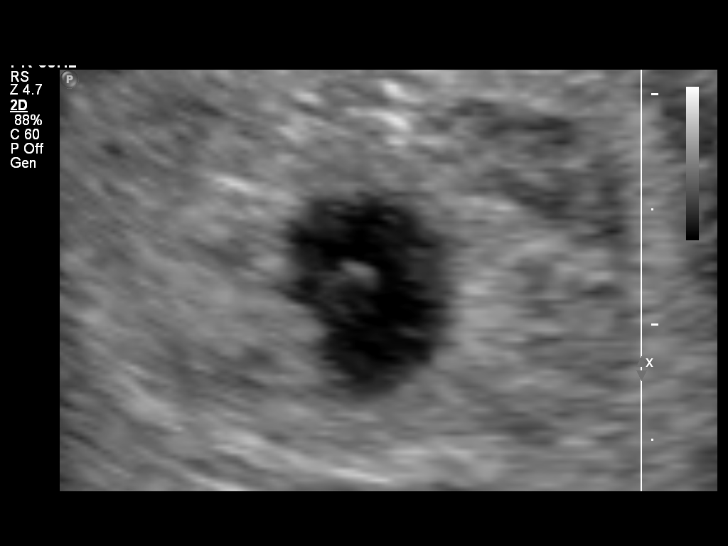
[im 32/46]
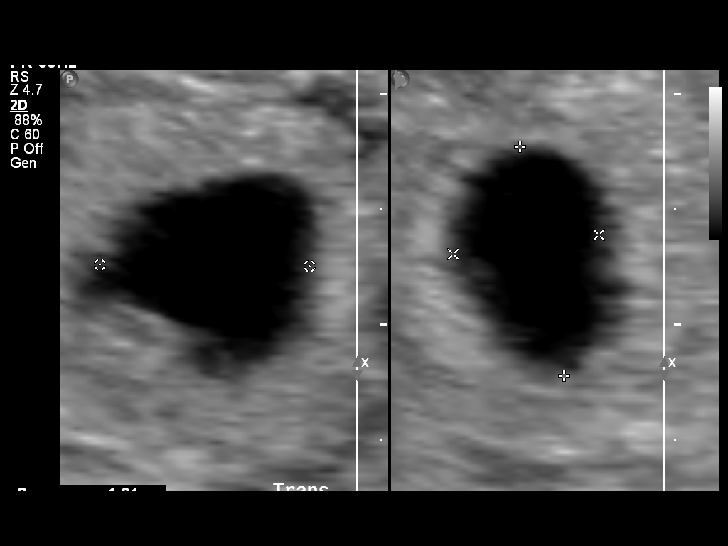
[im 36/46]
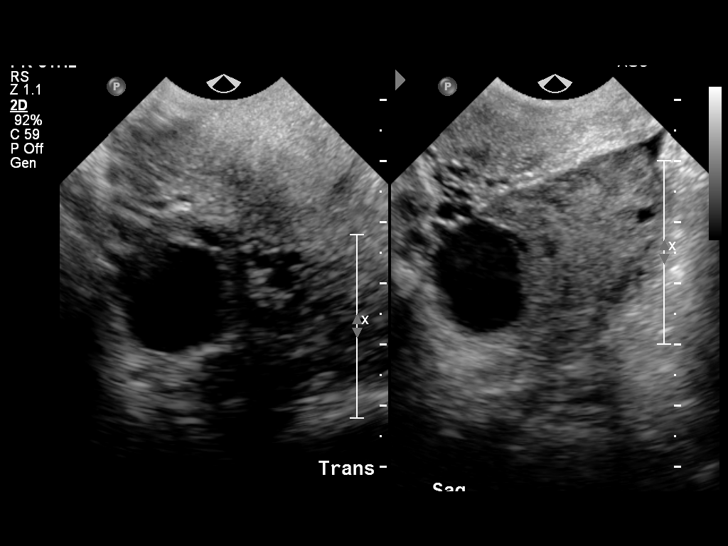
[im 39/46]
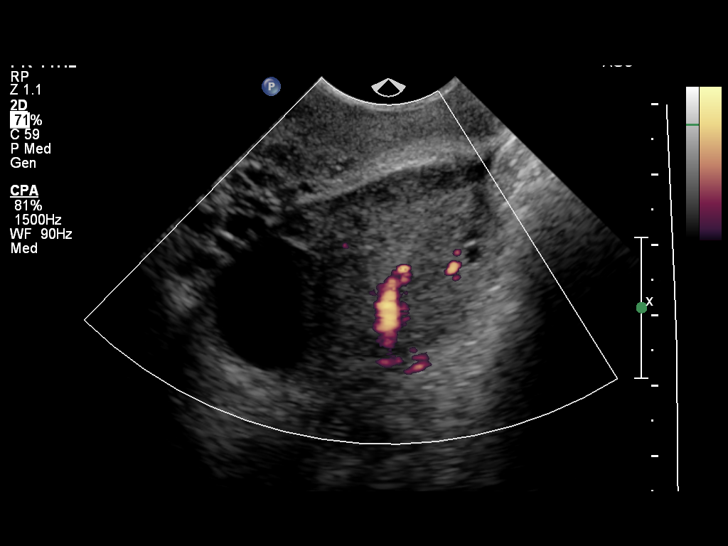
[im 42/46]
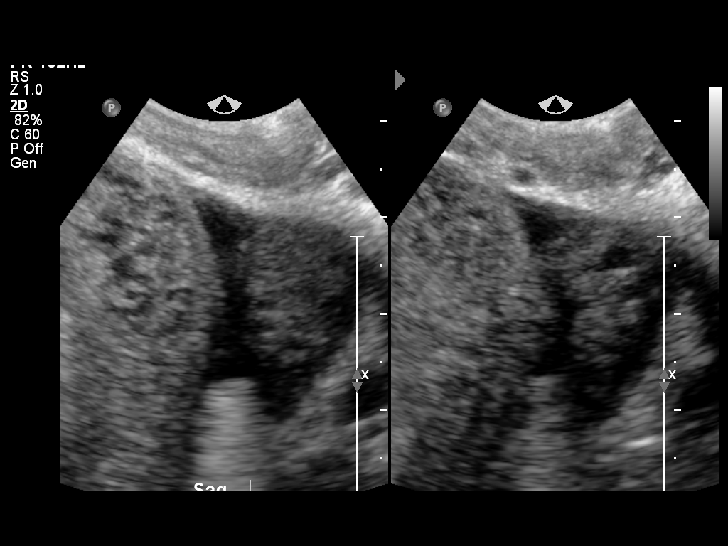
[im 46/46]
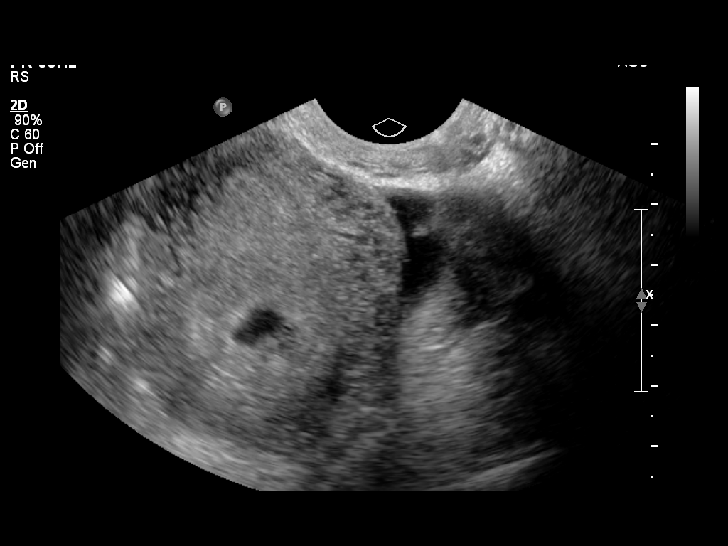

[14 of 28 positions shown; findings below may reference images not displayed]

FINDINGS: Intrauterine gestational sac: Visualized/normal in shape.

Yolk sac:  Probable.

Embryo:  Not present

Cardiac Activity: Not present

MSD: 8.5  mm   5 w   3  d

Maternal uterus/adnexae: The right ovary measures 4.3 x 2.7 x
cm. There is a 1.9 x 1.6 x 1.6 cm right ovarian cyst. The left ovary
measures 2.2 x 1.2 x 1.4 cm. There is a small subchorionic
hemorrhage.
IMPRESSION: Probable early intrauterine gestational sac and yolk sac without
fetal pole or cardiac activity yet visualized. Recommend follow-up
quantitative B-HCG levels and follow-up US in 14 days to confirm and
assess viability. This recommendation follows SRU consensus
guidelines: Diagnostic Criteria for Nonviable Pregnancy Early in the
First Trimester. N Engl J Med 5811; [DATE].

## 2016-04-13 ENCOUNTER — Encounter (HOSPITAL_COMMUNITY): Payer: Self-pay | Admitting: Emergency Medicine

## 2016-04-13 ENCOUNTER — Ambulatory Visit (HOSPITAL_COMMUNITY)
Admission: EM | Admit: 2016-04-13 | Discharge: 2016-04-13 | Disposition: A | Discharge status: Home / Self Care | Payer: Managed Care, Other (non HMO) | Attending: Emergency Medicine | Admitting: Emergency Medicine

## 2016-04-13 DIAGNOSIS — H6092 Unspecified otitis externa, left ear: Secondary | ICD-10-CM | POA: Diagnosis not present

## 2016-04-13 DIAGNOSIS — H6122 Impacted cerumen, left ear: Secondary | ICD-10-CM | POA: Diagnosis not present

## 2016-04-13 MED ORDER — NEOMYCIN-POLYMYXIN-HC 3.5-10000-1 OT SUSP
4.0000 [drp] | Freq: Four times a day (QID) | OTIC | 0 refills | Status: DC
Start: 2016-04-13 — End: 2021-03-21

## 2016-04-13 NOTE — Discharge Instructions (Signed)
After completion of treatment rinse ear canals as shown.

## 2016-04-13 NOTE — ED Provider Notes (Signed)
CSN: [id number]     Arrival date & time 04/13/16  1340 History   First MD Initiated Contact with Patient 04/13/16 1411     Chief Complaint  Patient presents with  . Otalgia   (Consider location/radiation/quality/duration/timing/severity/associated sxs/prior Treatment) 20 year old female complaining of left earache for 3 days. Is being getting progressively worse. There is tenderness surrounding the left outer ear. Today she is noted some discomfort to the right ear but this is mild. Occasionally she will have some decrease in hearing. It tends to wax and wane. She has not been applying a friend's ear drops, name unknown. She has been taking Tylenol when necessary. She has also been using Q-tips to try to clean her ears out      Past Medical History:  Diagnosis Date  . Chlamydia   . Gonorrhea   . Hx of varicella   . Infection    urination   Past Surgical History:  Procedure Laterality Date  . UMBILICAL HERNIA REPAIR     Family History  Problem Relation Age of Onset  . Hypertension Father   . Diabetes Maternal Grandmother   . Diabetes Paternal Grandmother   . Cancer Paternal Grandfather   . Cancer Maternal Aunt    Social History  Substance Use Topics  . Smoking status: Never Smoker  . Smokeless tobacco: Never Used  . Alcohol use No   OB History    Gravida Para Term Preterm AB Living   1 1 1    0 1   SAB TAB Ectopic Multiple Live Births   0     0 1     Review of Systems  Constitutional: Negative.   HENT: Positive for ear discharge and ear pain. Negative for congestion, facial swelling, postnasal drip, rhinorrhea, sore throat, tinnitus and trouble swallowing.   Eyes: Negative.   Respiratory: Negative.   Gastrointestinal: Negative.   Neurological: Negative.   All other systems reviewed and are negative.   Allergies  Review of patient's allergies indicates no known allergies.  Home Medications   Prior to Admission medications   Medication Sig Start Date End Date  Taking? Authorizing Provider  fluconazole (DIFLUCAN) 150 MG tablet Take 1 tablet (150 mg total) by mouth daily. 12/06/15   Barbaraann Barthel, MD  fluconazole (DIFLUCAN) 150 MG tablet Take 1 tablet (150 mg total) by mouth once. 12/06/15   Barbaraann Barthel, MD  neomycin-polymyxin-hydrocortisone (CORTISPORIN) 3.5-10000-1 otic suspension Place 4 drops into the left ear 4 (four) times daily. X 7 days 04/13/16   Hayden Rasmussen, NP   Meds Ordered and Administered this Visit  Medications - No data to display  BP 128/79 (BP Location: Right Arm)   Pulse 81   Temp 98.4 F (36.9 C) (Oral)   LMP 03/15/2016 (Exact Date)   SpO2 96%  No data found.   Physical Exam  Constitutional: She is oriented to person, place, and time. She appears well-developed and well-nourished. No distress.  HENT:  Head: Normocephalic and atraumatic.  Nose: Nose normal.  Mouth/Throat: Oropharynx is clear and moist. No oropharyngeal exudate.  Left EAC with swelling and moisture. Few white exudates and cerumen in the canal. Positive for tenderness. TM is not visualized due to EAC debris.  Right TM not visualized due to cerumen in the EAC.  Neck: Normal range of motion. Neck supple.  Cardiovascular: Normal rate.   Pulmonary/Chest: Effort normal.  Neurological: She is alert and oriented to person, place, and time.  Skin: Skin is warm and dry.  Psychiatric: She has a normal mood and affect.  Nursing note and vitals reviewed.   Urgent Care Course   Clinical Course    Procedures (including critical care time)  Labs Review Labs Reviewed - No data to display  Imaging Review No results found.   Visual Acuity Review  Right Eye Distance:   Left Eye Distance:   Bilateral Distance:    Right Eye Near:   Left Eye Near:    Bilateral Near:         MDM   1. Otitis externa, left   2. Excessive cerumen in left ear canal    Meds ordered this encounter  Medications  . neomycin-polymyxin-hydrocortisone (CORTISPORIN)  3.5-10000-1 otic suspension    Sig: Place 4 drops into the left ear 4 (four) times daily. X 7 days    Dispense:  7.5 mL    Refill:  0    Order Specific Question:   Supervising Provider    Answer:   Charm Rings [id number]   Home ear irrigation as per instructions post tx.    Hayden Rasmussen, NP 04/13/16 [phone]

## 2016-04-13 NOTE — ED Triage Notes (Signed)
Patient presents with bilateral ear pain x3 days, pt has used ear drops and Tylenol to treat pain No acute distress

## 2017-08-10 ENCOUNTER — Ambulatory Visit (HOSPITAL_COMMUNITY)
Admission: EM | Admit: 2017-08-10 | Discharge: 2017-08-10 | Disposition: A | Discharge status: Home / Self Care | Payer: PRIVATE HEALTH INSURANCE | Attending: Internal Medicine | Admitting: Internal Medicine

## 2017-08-10 ENCOUNTER — Encounter (HOSPITAL_COMMUNITY): Payer: Self-pay | Admitting: Family Medicine

## 2017-08-10 DIAGNOSIS — B9689 Other specified bacterial agents as the cause of diseases classified elsewhere: Secondary | ICD-10-CM | POA: Diagnosis not present

## 2017-08-10 DIAGNOSIS — N898 Other specified noninflammatory disorders of vagina: Secondary | ICD-10-CM | POA: Diagnosis present

## 2017-08-10 DIAGNOSIS — N760 Acute vaginitis: Secondary | ICD-10-CM

## 2017-08-10 MED ORDER — METRONIDAZOLE 500 MG PO TABS: 500.0000 mg | ORAL_TABLET | Freq: Two times a day (BID) | ORAL | 0 refills | Status: DC

## 2017-08-10 NOTE — ED Provider Notes (Signed)
[facility]    CSN: [id number] Arrival date & time: 08/10/17  1643     History   Chief Complaint Chief Complaint  Patient presents with  . Vaginitis    HPI Tasha Chen is a 21 y.o. female.  21 y.o. female presents with vaginal burning and "redness" to vagina X 2 weeks. Condition is acute and persistent  in nature. Condition is made better by nothing. Condition is made worse by nothign. Patient denies any relief from cleocin prescribed at [facility] prior to there arrival at this facility. Patient states that she is a monogamous relationship. Denies any unual discharge or foul smell. Patient is pregnant approximately 27 weeks.     Past Medical History:  Diagnosis Date  . Chlamydia   . Gonorrhea   . Hx of varicella   . Infection    urination    Patient Active Problem List   Diagnosis Date Noted  . Vaginal delivery 02/16/2015  . 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 H/O umbilical hernia repair--age 60 02/15/2015  . Positive GBS test 02/13/2015  . Overweight (BMI 25.0-29.9) 06/11/2014    Past Surgical History:  Procedure Laterality Date  . UMBILICAL HERNIA REPAIR      OB History    Gravida Para Term Preterm AB Living   2 1 1    0 1   SAB TAB Ectopic Multiple Live Births   0     0 1       Home Medications    Prior to Admission medications   Medication Sig Start Date End Date Taking? Authorizing Provider  neomycin-polymyxin-hydrocortisone (CORTISPORIN) 3.5-10000-1 otic suspension Place 4 drops into the left ear 4 (four) times daily. X 7 days 04/13/16   Hayden RasmussenMabe, David, NP    Family History Family History  Problem Relation Age of Onset  . Hypertension Father   . Diabetes Maternal Grandmother   . Diabetes Paternal Grandmother   . Cancer Paternal Grandfather   . Cancer Maternal Aunt     Social History Social History   Tobacco Use  . Smoking status: Never Smoker  . Smokeless tobacco: Never Used  Substance Use Topics  . Alcohol use:  No  . Drug use: No     Allergies   Patient has no known allergies.   Review of Systems Review of Systems  Constitutional: Negative for chills and fever.  HENT: Negative for ear pain and sore throat.   Eyes: Negative for pain and visual disturbance.  Respiratory: Negative for cough and shortness of breath.   Cardiovascular: Negative for chest pain and palpitations.  Gastrointestinal: Negative for abdominal pain and vomiting.  Genitourinary: Negative for dysuria and hematuria.  Musculoskeletal: Negative for arthralgias and back pain.  Skin: Negative for color change and rash.       Burning to vagina  Neurological: Negative for seizures and syncope.  All other systems reviewed and are negative.    Physical Exam Triage Vital Signs ED Triage Vitals [08/10/17 1739]  Enc Vitals Group     BP 118/74     Pulse Rate 75     Resp 18     Temp 98.1 F (36.7 C)     Temp src      SpO2 100 %     Weight      Height      Head Circumference      Peak Flow      Pain Score      Pain Loc  Pain Edu?      Excl. in GC?    No data found.  Updated Vital Signs BP 118/74   Pulse 75   Temp 98.1 F (36.7 C)   Resp 18   SpO2 100%   Visual Acuity Right Eye Distance:   Left Eye Distance:   Bilateral Distance:    Right Eye Near:   Left Eye Near:    Bilateral Near:     Physical Exam  Constitutional: She is oriented to person, place, and time. She appears well-developed and well-nourished.  HENT:  Head: Normocephalic and atraumatic.  Eyes: Conjunctivae are normal.  Neck: Normal range of motion.  Pulmonary/Chest: Effort normal.  Musculoskeletal: Normal range of motion.  Neurological: She is alert and oriented to person, place, and time.  Skin: Skin is warm.  Psychiatric: She has a normal mood and affect.  Nursing note and vitals reviewed.    UC Treatments / Results  Labs (all labs ordered are listed, but only abnormal results are displayed) Labs Reviewed  URINE  CYTOLOGY ANCILLARY ONLY    EKG  EKG Interpretation None       Radiology No results found.  Procedures Procedures (including critical care time)  Medications Ordered in UC Medications - No data to display   Initial Impression / Assessment and Plan / UC Course  I have reviewed the triage vital signs and the nursing notes.  Pertinent labs & imaging results that were available during my care of the patient were reviewed by me and considered in my medical decision making (see chart for details).    Flagyl confirmed category B on UpToDate   Final Clinical Impressions(s) / UC Diagnoses   Final diagnoses:  None    ED Discharge Orders    None       Controlled Substance Prescriptions [place] Controlled Substance Registry consulted? Not Applicable   Alene MiresOmohundro, Pecolia Marando C, NP 08/10/17 1810

## 2017-08-10 NOTE — ED Triage Notes (Signed)
Pt here for vaginal discharge and burning and irritation. sts uncomfortable to walk due to burning.

## 2017-08-10 NOTE — Discharge Instructions (Signed)
Can take over the counter probiotics. If okay by OBGYN patient can be called in flagyl. Please have obgyn office contact urgent care directly.

## 2017-08-12 LAB — URINE CYTOLOGY ANCILLARY ONLY
Chlamydia: NEGATIVE
NEISSERIA GONORRHEA: NEGATIVE
TRICH (WINDOWPATH): NEGATIVE

## 2017-08-14 LAB — URINE CYTOLOGY ANCILLARY ONLY
Bacterial vaginitis: NEGATIVE
CANDIDA VAGINITIS: NEGATIVE

## 2021-03-21 ENCOUNTER — Ambulatory Visit
Admission: EM | Admit: 2021-03-21 | Discharge: 2021-03-21 | Disposition: A | Discharge status: Home / Self Care | Payer: PRIVATE HEALTH INSURANCE | Attending: Student | Admitting: Student

## 2021-03-21 ENCOUNTER — Other Ambulatory Visit: Payer: Self-pay

## 2021-03-21 ENCOUNTER — Encounter: Payer: Self-pay | Admitting: Emergency Medicine

## 2021-03-21 DIAGNOSIS — Z3A10 10 weeks gestation of pregnancy: Secondary | ICD-10-CM | POA: Diagnosis not present

## 2021-03-21 DIAGNOSIS — H66002 Acute suppurative otitis media without spontaneous rupture of ear drum, left ear: Secondary | ICD-10-CM | POA: Diagnosis not present

## 2021-03-21 MED ORDER — AMOXICILLIN 500 MG PO CAPS: 500.0000 mg | ORAL_CAPSULE | Freq: Two times a day (BID) | ORAL | 0 refills | Status: AC

## 2021-03-21 NOTE — ED Provider Notes (Signed)
[facility]    CSN: [id number] Arrival date & time: 03/21/21  1753      History   Chief Complaint Chief Complaint  Patient presents with   Otalgia    HPI Tasha Chen is a 25 y.o. female presenting with bilateral ear pain L>R. History recurrent AOM. States tried to clean her ears out few days ago and following this developed L ear pain, sharp. Denies hearing changes, tinnitus, dizziness, fevers/chills, cough. Pt gets very frequent ear infections and this feels like that. Pt is [redacted] weeks pregnant.  denies recent URI.  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 Authorizing Provider  amoxicillin (AMOXIL) 500 MG capsule Take 1 capsule (500 mg total) by mouth 2 (two) times daily for 7 days. 03/21/21 03/28/21 Yes Rhys Martini, PA-C    Family History Family History  Problem Relation Age of Onset   Hypertension Father    Diabetes Maternal Grandmother    Diabetes Paternal Grandmother    Cancer Paternal Grandfather    Cancer Maternal Aunt     Social History Social History   Tobacco Use   Smoking status: Never   Smokeless tobacco: Never  Substance Use Topics   Alcohol use: No   Drug use: No     Allergies   Patient has no known allergies.   Review of  Systems Review of Systems  Constitutional:  Negative for appetite change, chills and fever.  HENT:  Positive for ear pain. Negative for congestion, rhinorrhea, sinus pressure, sinus pain and sore throat.   Eyes:  Negative for redness and visual disturbance.  Respiratory:  Negative for cough, chest tightness, shortness of breath and wheezing.   Cardiovascular:  Negative for chest pain and palpitations.  Gastrointestinal:  Negative for abdominal pain, constipation, diarrhea, nausea and vomiting.  Genitourinary:  Negative for dysuria, frequency and urgency.  Musculoskeletal:  Negative for myalgias.  Neurological:  Negative for dizziness, weakness and headaches.  Psychiatric/Behavioral:  Negative for confusion.   All other systems reviewed and are negative.   Physical Exam Triage Vital Signs ED Triage Vitals  Enc Vitals Group     BP 03/21/21 1933 122/89     Pulse Rate 03/21/21 1933 85     Resp 03/21/21 1933 20     Temp 03/21/21 1933 97.7 F (36.5 C)     Temp Source 03/21/21 1933 Oral     SpO2 03/21/21 1933 98 %  Weight 03/21/21 1937 220 lb (99.8 kg)     Height 03/21/21 1937 5\' 4"  (1.626 m)     Head Circumference --      Peak Flow --      Pain Score 03/21/21 1937 8     Pain Loc --      Pain Edu? --      Excl. in GC? --    No data found.  Updated Vital Signs BP 122/89   Pulse 85   Temp 97.7 F (36.5 C) (Oral)   Resp 20   Ht 5\' 4"  (1.626 m)   Wt 220 lb (99.8 kg)   SpO2 98%   BMI 37.76 kg/m   Visual Acuity Right Eye Distance:   Left Eye Distance:   Bilateral Distance:    Right Eye Near:   Left Eye Near:    Bilateral Near:     Physical Exam Vitals reviewed.  Constitutional:      General: She is not in acute distress.    Appearance: Normal appearance. She is not ill-appearing.  HENT:     Head: Normocephalic and atraumatic.     Right Ear: Hearing, tympanic membrane, ear canal and external ear normal. No swelling or tenderness. There is no impacted cerumen. No  mastoid tenderness. Tympanic membrane is not perforated, erythematous, retracted or bulging.     Left Ear: Hearing, ear canal and external ear normal. Tenderness present. No swelling. There is no impacted cerumen. No mastoid tenderness. Tympanic membrane is erythematous. Tympanic membrane is not perforated, retracted or bulging.     Nose:     Right Sinus: No maxillary sinus tenderness or frontal sinus tenderness.     Left Sinus: No maxillary sinus tenderness or frontal sinus tenderness.     Mouth/Throat:     Mouth: Mucous membranes are moist.     Pharynx: Uvula midline. No oropharyngeal exudate or posterior oropharyngeal erythema.     Tonsils: No tonsillar exudate.  Cardiovascular:     Rate and Rhythm: Normal rate and regular rhythm.     Heart sounds: Normal heart sounds.  Pulmonary:     Breath sounds: Normal breath sounds and air entry. No wheezing, rhonchi or rales.  Chest:     Chest wall: No tenderness.  Abdominal:     General: Abdomen is flat. Bowel sounds are normal.     Tenderness: There is no abdominal tenderness. There is no guarding or rebound.  Lymphadenopathy:     Cervical: No cervical adenopathy.  Neurological:     General: No focal deficit present.     Mental Status: She is alert and oriented to person, place, and time.  Psychiatric:        Attention and Perception: Attention and perception normal.        Mood and Affect: Mood and affect normal.        Behavior: Behavior normal. Behavior is cooperative.        Thought Content: Thought content normal.        Judgment: Judgment normal.     UC Treatments / Results  Labs (all labs ordered are listed, but only abnormal results are displayed) Labs Reviewed - No data to display  EKG   Radiology No results found.  Procedures Procedures (including critical care time)  Medications Ordered in UC Medications - No data to display  Initial Impression / Assessment and Plan / UC Course  I have reviewed the triage vital  signs and the nursing notes.  Pertinent labs &  imaging results that were available during my care of the patient were reviewed by me and considered in my medical decision making (see chart for details).     This patient is a very pleasant 25 y.o. year old female presenting with L AOM. Today this pt is afebrile nontachycardic nontachypneic, oxygenating well on room air, no wheezes rhonchi or rales.   She is [redacted] weeks pregnant. Amoxicillin as below.  ED return precautions discussed. Patient verbalizes understanding and agreement.    Final Clinical Impressions(s) / UC Diagnoses   Final diagnoses:  [redacted] weeks gestation of pregnancy  Non-recurrent acute suppurative otitis media of left ear without spontaneous rupture of tympanic membrane     Discharge Instructions      -Start the antibiotic-Amoxicillin- 1 pill every 12 hours for 7 days.  You can take this with food like with breakfast and dinner. -Follow-up if symptoms persist. -Tylenol for discomfort      ED Prescriptions     Medication Sig Dispense Auth. Provider   amoxicillin (AMOXIL) 500 MG capsule Take 1 capsule (500 mg total) by mouth 2 (two) times daily for 7 days. 14 capsule Rhys Martini, PA-C      PDMP not reviewed this encounter.   Rhys Martini, PA-C 03/21/21 2003

## 2021-03-21 NOTE — Discharge Instructions (Addendum)
-  Start the antibiotic-Amoxicillin- 1 pill every 12 hours for 7 days.  You can take this with food like with breakfast and dinner. -Follow-up if symptoms persist. -Tylenol for discomfort

## 2021-03-21 NOTE — ED Triage Notes (Signed)
Pt here with bilateral ear pain, left being worse. Pt gets very frequent ear infections and this feels like that. Pt is [redacted] weeks pregnant.

## 2021-03-31 ENCOUNTER — Other Ambulatory Visit: Payer: Self-pay

## 2021-03-31 ENCOUNTER — Ambulatory Visit
Admission: RE | Admit: 2021-03-31 | Discharge: 2021-03-31 | Disposition: A | Discharge status: Home / Self Care | Payer: Self-pay | Source: Ambulatory Visit | Attending: Student | Admitting: Student

## 2021-03-31 VITALS — BP 136/86 | HR 78 | Temp 98.5°F | Resp 18

## 2021-03-31 DIAGNOSIS — N939 Abnormal uterine and vaginal bleeding, unspecified: Secondary | ICD-10-CM

## 2021-03-31 DIAGNOSIS — H60332 Swimmer's ear, left ear: Secondary | ICD-10-CM

## 2021-03-31 DIAGNOSIS — H6690 Otitis media, unspecified, unspecified ear: Secondary | ICD-10-CM

## 2021-03-31 DIAGNOSIS — Z3A12 12 weeks gestation of pregnancy: Secondary | ICD-10-CM

## 2021-03-31 DIAGNOSIS — O99511 Diseases of the respiratory system complicating pregnancy, first trimester: Secondary | ICD-10-CM

## 2021-03-31 MED ORDER — OFLOXACIN 0.3 % OT SOLN: 2.0000 [drp] | Freq: Two times a day (BID) | OTIC | 0 refills | Status: DC

## 2021-03-31 MED ORDER — CIPROFLOXACIN-DEXAMETHASONE 0.3-0.1 % OT SUSP: 2.0000 [drp] | Freq: Two times a day (BID) | OTIC | 0 refills | Status: AC

## 2021-03-31 NOTE — ED Provider Notes (Signed)
[facility]    CSN: [id number] Arrival date & time: 03/31/21  1658      History   Chief Complaint Chief Complaint  Patient presents with   Otalgia   Vaginal Discharge    HPI Tasha Chen is a 25 y.o. female presenting for follow-up of ear infection, and also 8 days of vaginal spotting. History miscarriage at 10 weeks in the past.  I last saw this patient on 03/21/2021 and treated her with amoxicillin for AOM at that time, she is [redacted] weeks pregnant.  She endorses long history of frequent ear infections but has not followed with ENT for this. Now with L ear pain, muffled hearing. Denies tinnitus, dizziness, fevers/chills. Other than vaginal spotting, denies urinary symptoms like dysuria, frequency, abd pain, back pain.  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 Authorizing Provider  ofloxacin (FLOXIN) 0.3 % OTIC solution Place 2 drops into the left ear 2 (two) times daily for 7 days. 03/31/21 04/07/21 Yes Rhys Martini, PA-C    Family History Family History  Problem Relation Age of Onset   Hypertension Father    Diabetes Maternal Grandmother    Diabetes Paternal Grandmother    Cancer Paternal Grandfather    Cancer Maternal Aunt     Social History Social History    Tobacco Use   Smoking status: Never   Smokeless tobacco: Never  Substance Use Topics   Alcohol use: No   Drug use: No     Allergies   Patient has no known allergies.   Review of Systems Review of Systems  Constitutional:  Negative for appetite change, chills and fever.  HENT:  Negative for congestion, ear pain, rhinorrhea, sinus pressure, sinus pain and sore throat.   Eyes:  Negative for pain, redness and visual disturbance.  Respiratory:  Negative for cough, chest tightness, shortness of breath and wheezing.   Cardiovascular:  Negative for chest pain and palpitations.  Gastrointestinal:  Negative for abdominal pain, constipation, diarrhea, nausea and vomiting.  Genitourinary:  Positive for menstrual problem. Negative for decreased urine volume, difficulty urinating, dysuria, flank pain, frequency, genital sores, hematuria, pelvic pain, urgency, vaginal bleeding, vaginal discharge and vaginal pain.  Musculoskeletal:  Negative for back pain and myalgias.  Skin:  Negative for rash.  Neurological:  Negative for dizziness, weakness and headaches.  Psychiatric/Behavioral:  Negative for confusion.   All other systems reviewed and are negative.   Physical Exam Triage  Vital Signs ED Triage Vitals [03/31/21 1707]  Enc Vitals Group     BP 136/86     Pulse Rate 78     Resp 18     Temp 98.5 F (36.9 C)     Temp Source Oral     SpO2 98 %     Weight      Height      Head Circumference      Peak Flow      Pain Score      Pain Loc      Pain Edu?      Excl. in GC?    No data found.  Updated Vital Signs BP 136/86 (BP Location: Left Arm)   Pulse 78   Temp 98.5 F (36.9 C) (Oral)   Resp 18   SpO2 98%   Visual Acuity Right Eye Distance:   Left Eye Distance:   Bilateral Distance:    Right Eye Near:   Left Eye Near:    Bilateral Near:     Physical Exam Vitals reviewed.  Constitutional:      General: She is not in acute distress.    Appearance: Normal appearance.  She is not ill-appearing.  HENT:     Head: Normocephalic and atraumatic.     Right Ear: Hearing, tympanic membrane, ear canal and external ear normal. No swelling or tenderness. No middle ear effusion. There is no impacted cerumen. No mastoid tenderness. Tympanic membrane is not injected, scarred, perforated, erythematous, retracted or bulging.     Left Ear: Hearing, tympanic membrane, ear canal and external ear normal. Swelling and tenderness present.  No middle ear effusion. There is no impacted cerumen. No mastoid tenderness. Tympanic membrane is not injected, scarred, perforated, erythematous, retracted or bulging.     Ears:     Comments: Exudate lining L canal    Mouth/Throat:     Mouth: Mucous membranes are moist.     Pharynx: Oropharynx is clear. No oropharyngeal exudate or posterior oropharyngeal erythema.     Comments: Moist mucous membranes Eyes:     Extraocular Movements: Extraocular movements intact.     Pupils: Pupils are equal, round, and reactive to light.  Cardiovascular:     Rate and Rhythm: Normal rate and regular rhythm.     Heart sounds: Normal heart sounds.  Pulmonary:     Effort: Pulmonary effort is normal.     Breath sounds: Normal breath sounds. No wheezing, rhonchi or rales.  Abdominal:     General: Bowel sounds are normal. There is no distension.     Palpations: Abdomen is soft. There is no mass.     Tenderness: There is no abdominal tenderness. There is no right CVA tenderness, left CVA tenderness, guarding or rebound.     Comments: No abd tenderness  Genitourinary:    Comments: deferred Lymphadenopathy:     Cervical: No cervical adenopathy.  Skin:    General: Skin is warm.     Capillary Refill: Capillary refill takes less than 2 seconds.     Comments: Good skin turgor  Neurological:     General: No focal deficit present.     Mental Status: She is alert and oriented to person, place, and time.  Psychiatric:        Mood and Affect: Mood normal.         Behavior: Behavior normal.        Thought Content: Thought content normal.        Judgment: Judgment  normal.     UC Treatments / Results  Labs (all labs ordered are listed, but only abnormal results are displayed) Labs Reviewed - No data to display  EKG   Radiology No results found.  Procedures Procedures (including critical care time)  Medications Ordered in UC Medications - No data to display  Initial Impression / Assessment and Plan / UC Course  I have reviewed the triage vital signs and the nursing notes.  Pertinent labs & imaging results that were available during my care of the patient were reviewed by me and considered in my medical decision making (see chart for details).     This patient is a very pleasant 25 y.o. year old female presenting with L otitis externa and vaginal spotting at [redacted] weeks pregnant.   She is [redacted] weeks pregnant. Complaining of bloody discharge x8 days. Has miscarried at 10 weeks in the past. I am sending this patient straight to [facility] for further evaluation and management, as I cannot diagnose or treat a miscarriage here. She states she'll drive straight there.  Ofloxacin drops, clean dry ear precautions. Given chronicity of issue, please f/u with ENT.    Final Clinical Impressions(s) / UC Diagnoses   Final diagnoses:  [redacted] weeks gestation of pregnancy  Diseases of the respiratory system complicating pregnancy, first trimester  Recurrent AOM (acute otitis media)  Vaginal spotting  Acute swimmer's ear of left side     Discharge Instructions      -Ofloxacin drops L ear 2x daily x7 days -Keep L ear dry- use earplug in shower, etc -Vaginal spotting in pregnancy is a concerning symptom. I cannot rule out early miscarriage. It's very important you head straight to the [facility] hospital) to check on baby. Please head straight there. Information below.     ED Prescriptions     Medication Sig Dispense Auth. Provider   ofloxacin (FLOXIN)  0.3 % OTIC solution Place 2 drops into the left ear 2 (two) times daily for 7 days. 3 mL Rhys Martini, PA-C      PDMP not reviewed this encounter.   Rhys Martini, PA-C 03/31/21 1727

## 2021-03-31 NOTE — Discharge Instructions (Addendum)
-  Ofloxacin drops L ear 2x daily x7 days -Keep L ear dry- use earplug in shower, etc -Vaginal spotting in pregnancy is a concerning symptom. I cannot rule out early miscarriage. It's very important you head straight to the [facility] hospital) to check on baby. Please head straight there. Information below.

## 2021-03-31 NOTE — ED Triage Notes (Signed)
Pt was last seen at [facility] on 7/12 for otitis media tx with antibiotics. Pt feels like sxs are worsening. Left ear pain greater than right.  Pt is [redacted] weeks pregnant. At ten weeks she miscarried one of the fetuses. Today, she complains of 8 days of bloody discharge that has been more consistent lately.
